# Patient Record
Sex: Female | Born: 1990 | Race: Black or African American | Hispanic: No | Marital: Single | Smoking: Never smoker
Health system: Southern US, Community
[De-identification: ages and names within clinical notes are randomized; demographics above are authoritative.]

## PROBLEM LIST (undated history)

## (undated) DIAGNOSIS — R87629 Unspecified abnormal cytological findings in specimens from vagina: Secondary | ICD-10-CM

## (undated) DIAGNOSIS — D649 Anemia, unspecified: Secondary | ICD-10-CM

## (undated) DIAGNOSIS — N946 Dysmenorrhea, unspecified: Secondary | ICD-10-CM

## (undated) DIAGNOSIS — Z91013 Allergy to seafood: Secondary | ICD-10-CM

## (undated) DIAGNOSIS — I10 Essential (primary) hypertension: Secondary | ICD-10-CM

## (undated) DIAGNOSIS — O139 Gestational [pregnancy-induced] hypertension without significant proteinuria, unspecified trimester: Secondary | ICD-10-CM

## (undated) HISTORY — DX: Essential (primary) hypertension: I10

## (undated) HISTORY — DX: Dysmenorrhea, unspecified: N94.6

## (undated) HISTORY — DX: Allergy to seafood: Z91.013

## (undated) HISTORY — DX: Anemia, unspecified: D64.9

## (undated) HISTORY — PX: TONSILLECTOMY: SUR1361

---
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
## 2013-10-20 ENCOUNTER — Ambulatory Visit: Payer: Medicaid Other | Admitting: Family

## 2013-10-28 ENCOUNTER — Encounter: Payer: Self-pay | Admitting: Family

## 2013-10-28 ENCOUNTER — Ambulatory Visit (INDEPENDENT_AMBULATORY_CARE_PROVIDER_SITE_OTHER): Payer: Commercial Managed Care - PPO | Admitting: Family

## 2013-10-28 VITALS — BP 100/90 | HR 100 | Ht 60.0 in | Wt 189.0 lb

## 2013-10-28 DIAGNOSIS — Z0000 Encounter for general adult medical examination without abnormal findings: Secondary | ICD-10-CM

## 2013-10-28 MED ORDER — EPINEPHRINE 0.3 MG/0.3ML IJ SOAJ: 0.3000 mg | Freq: Once | INTRAMUSCULAR | Status: DC

## 2013-10-28 NOTE — Progress Notes (Signed)
Subjective:    Patient ID: Jennifer Curtis, female    DOB: 01/26/1991, 23 y.o.   MRN: [id number]  HPI  23 year old [place] female, new patient to the practice and to be established. 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 She is sexually active with one female partner and sees gynecology for woman care. Reports exercising approximately once per week. Perform self breast exams monthly. Immunizations up-to-date. Has a fish allergy.   Review of Systems  Constitutional: Negative.   HENT: Negative.   Eyes: Negative.   Respiratory: Negative.   Cardiovascular: Negative.   Gastrointestinal: Negative.   Endocrine: Negative.   Genitourinary: Negative.   Musculoskeletal: Negative.   Skin: Negative.   Allergic/Immunologic: Negative.   Neurological: Negative.   Hematological: Negative.   Psychiatric/Behavioral: Negative.    History reviewed. No pertinent past medical history.  History   Social History  . Marital Status: Single    Spouse Name: N/A    Number of Children: N/A  . Years of Education: N/A   Occupational History  . Not on file.   Social History Main Topics  . Smoking status: Never Smoker   . Smokeless tobacco: Not on file  . Alcohol Use: Yes     Comment: occassionally  . Drug Use: No  . Sexual Activity: Not on file   Other Topics Concern  . Not on file   Social History Narrative  . No narrative on file    Past Surgical History  Procedure Laterality Date  . Tonsillectomy      No family history on file.  Allergies  Allergen Reactions  . Fish Allergy Anaphylaxis    No current outpatient prescriptions on file prior to visit.   No current facility-administered medications on file prior to visit.    BP 100/90  Pulse 100  Ht 5' (1.524 m)  Wt 189 lb (85.73 kg)  BMI 36.91 kg/m2chart    Objective:   Physical Exam  Constitutional: She is oriented to person, place, and time. She appears well-developed and well-nourished.    HENT:  Head: Normocephalic and atraumatic.  Right Ear: External ear normal.  Left Ear: External ear normal.  Nose: Nose normal.  Mouth/Throat: Oropharynx is clear and moist.  Eyes: Conjunctivae and EOM are normal. Pupils are equal, round, and reactive to light.  Neck: Normal range of motion. Neck supple. No thyromegaly present.  Cardiovascular: Normal rate, regular rhythm and normal heart sounds.   Pulmonary/Chest: Effort normal and breath sounds normal.  Abdominal: Soft. Bowel sounds are normal. She exhibits no distension. There is no tenderness. There is no rebound.  Musculoskeletal: Normal range of motion. She exhibits no edema and no tenderness.  Neurological: She is alert and oriented to person, place, and time. She has normal reflexes. She displays normal reflexes. No cranial nerve deficit. Coordination normal.  Skin: Skin is warm and dry.  Psychiatric: She has a normal mood and affect.          Assessment & Plan:  Jennifer Curtis was seen today for establish care.  Diagnoses and associated orders for this visit:  Preventative health care - CMP; Future - CBC with Differential; Future - TSH; Future - Lipid Panel; Future  Other Orders - EPINEPHrine (EPIPEN) 0.3 mg/0.3 mL SOAJ injection; Inject 0.3 mLs (0.3 mg total) into the muscle once.    Return for fasting labs. Encouraged healthy diet and exercise. Weight reduction. Monthly self breast exams. Safe sex practices. Followup with gynecology for  Pap smear.

## 2013-10-28 NOTE — Progress Notes (Signed)
Pre visit review using our clinic review tool, if applicable. No additional management support is needed unless otherwise documented below in the visit note. 

## 2013-10-28 NOTE — Patient Instructions (Signed)

## 2013-11-03 ENCOUNTER — Other Ambulatory Visit (INDEPENDENT_AMBULATORY_CARE_PROVIDER_SITE_OTHER): Payer: Commercial Managed Care - PPO

## 2013-11-03 DIAGNOSIS — Z0000 Encounter for general adult medical examination without abnormal findings: Secondary | ICD-10-CM

## 2013-11-03 LAB — CBC WITH DIFFERENTIAL/PLATELET
BASOS ABS: 0 10*3/uL (ref 0.0–0.1)
BASOS PCT: 0.5 % (ref 0.0–3.0)
EOS ABS: 0.1 10*3/uL (ref 0.0–0.7)
Eosinophils Relative: 1.7 % (ref 0.0–5.0)
HCT: 37.6 % (ref 36.0–46.0)
HEMOGLOBIN: 12.4 g/dL (ref 12.0–15.0)
LYMPHS ABS: 3.2 10*3/uL (ref 0.7–4.0)
LYMPHS PCT: 39.9 % (ref 12.0–46.0)
MCHC: 32.9 g/dL (ref 30.0–36.0)
MCV: 90.9 fl (ref 78.0–100.0)
MONO ABS: 0.5 10*3/uL (ref 0.1–1.0)
Monocytes Relative: 6 % (ref 3.0–12.0)
NEUTROS ABS: 4.2 10*3/uL (ref 1.4–7.7)
Neutrophils Relative %: 51.9 % (ref 43.0–77.0)
Platelets: 234 10*3/uL (ref 150.0–400.0)
RBC: 4.14 Mil/uL (ref 3.87–5.11)
RDW: 13 % (ref 11.5–14.6)
WBC: 8.1 10*3/uL (ref 4.5–10.5)

## 2013-11-03 LAB — COMPREHENSIVE METABOLIC PANEL
ALK PHOS: 68 U/L (ref 39–117)
ALT: 21 U/L (ref 0–35)
AST: 16 U/L (ref 0–37)
Albumin: 3.6 g/dL (ref 3.5–5.2)
BUN: 11 mg/dL (ref 6–23)
CALCIUM: 8.9 mg/dL (ref 8.4–10.5)
CHLORIDE: 105 meq/L (ref 96–112)
CO2: 24 mEq/L (ref 19–32)
CREATININE: 1 mg/dL (ref 0.4–1.2)
GFR: 89.29 mL/min (ref >60.00)
Glucose, Bld: 77 mg/dL (ref 70–99)
POTASSIUM: 3.9 meq/L (ref 3.5–5.1)
Sodium: 135 mEq/L (ref 135–145)
Total Bilirubin: 0.7 mg/dL (ref 0.3–1.2)
Total Protein: 7 g/dL (ref 6.0–8.3)

## 2013-11-03 LAB — LIPID PANEL
CHOL/HDL RATIO: 3
Cholesterol: 114 mg/dL (ref 0–200)
HDL: 37 mg/dL — ABNORMAL LOW (ref >39.00)
LDL CALC: 63 mg/dL (ref 0–99)
TRIGLYCERIDES: 69 mg/dL (ref 0.0–149.0)
VLDL: 13.8 mg/dL (ref 0.0–40.0)

## 2013-11-03 LAB — TSH: TSH: 0.88 u[IU]/mL (ref 0.35–5.50)

## 2014-11-02 LAB — HM PAP SMEAR

## 2014-12-07 ENCOUNTER — Ambulatory Visit: Payer: Commercial Managed Care - PPO | Admitting: Family Medicine

## 2014-12-30 ENCOUNTER — Ambulatory Visit (INDEPENDENT_AMBULATORY_CARE_PROVIDER_SITE_OTHER): Payer: Commercial Managed Care - PPO | Admitting: Family Medicine

## 2014-12-30 DIAGNOSIS — R69 Illness, unspecified: Secondary | ICD-10-CM

## 2014-12-30 NOTE — Progress Notes (Signed)
No show for new patient visit.  ° °

## 2015-01-19 ENCOUNTER — Encounter: Payer: Self-pay | Admitting: Family Medicine

## 2015-01-19 ENCOUNTER — Ambulatory Visit (INDEPENDENT_AMBULATORY_CARE_PROVIDER_SITE_OTHER): Payer: Commercial Managed Care - PPO | Admitting: Family Medicine

## 2015-01-19 VITALS — BP 116/80 | HR 95 | Temp 98.1°F | Ht 60.0 in | Wt 187.4 lb

## 2015-01-19 DIAGNOSIS — Z7689 Persons encountering health services in other specified circumstances: Secondary | ICD-10-CM

## 2015-01-19 DIAGNOSIS — Z91013 Allergy to seafood: Secondary | ICD-10-CM | POA: Diagnosis not present

## 2015-01-19 DIAGNOSIS — D649 Anemia, unspecified: Secondary | ICD-10-CM | POA: Diagnosis not present

## 2015-01-19 DIAGNOSIS — N921 Excessive and frequent menstruation with irregular cycle: Secondary | ICD-10-CM | POA: Diagnosis not present

## 2015-01-19 DIAGNOSIS — N920 Excessive and frequent menstruation with regular cycle: Secondary | ICD-10-CM | POA: Insufficient documentation

## 2015-01-19 DIAGNOSIS — Z7189 Other specified counseling: Secondary | ICD-10-CM | POA: Diagnosis not present

## 2015-01-19 MED ORDER — EPINEPHRINE 0.3 MG/0.3ML IJ SOAJ: 0.3000 mg | Freq: Once | INTRAMUSCULAR | Status: DC

## 2015-01-19 NOTE — Progress Notes (Signed)
HPI:  Jennifer Curtis is here to establish care. Used to see Padonda.  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 Anemia     heavy menstrual bleeding, since 24 yo per her report  . Dysmenorrhea   . Shellfish allergy     Past Surgical History  Procedure Laterality Date  . Tonsillectomy      Family History  Problem Relation Age of Onset  . Diabetes Maternal Grandmother   . Diabetes Maternal Grandfather   . Diabetes Paternal Grandmother   . Kidney disease Paternal Grandmother   . Cancer Paternal Grandfather     History   Social History  . Marital Status: Single    Spouse Name: N/A  . Number of Children: N/A  . Years of Education: N/A   Social History Main Topics  . Smoking status: Never Smoker   . Smokeless tobacco: Not on file  . Alcohol Use: Yes     Comment: occassionally 1 glass per month  . Drug Use: No  . Sexual Activity: Not on file   Other Topics Concern  . 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  EPINEPHrine (EPIPEN) 0.3 mg/0.3 mL SOAJ injection, Inject 0.3 mLs (0.3 mg total) into the muscle once., Disp: 1 Device, Rfl: 4  EXAM:  Filed Vitals:   01/19/15 0948  BP: 116/80  Pulse: 95  Temp: 98.1 F (36.7 C)    Body mass index is 36.6 kg/(m^2).  GENERAL: vitals reviewed and listed above, alert, oriented, appears well hydrated and in no acute distress  HEENT: atraumatic, conjunttiva clear, no obvious abnormalities on inspection of external nose and ears  NECK: no obvious masses on inspection  LUNGS: clear to auscultation bilaterally, no wheezes, rales or rhonchi, good air movement  CV: HRRR, no peripheral edema  MS: moves all extremities without noticeable abnormality  PSYCH: pleasant and cooperative, no obvious depression or anxiety  ASSESSMENT AND PLAN:  Discussed the following assessment and plan:  Shellfish allergy -refilled epi - advised 911 if ever having serious reaction requiring epipen  Obesity: -lifestyle recs  Anemia, unspecified anemia type Menorrhagia with irregular cycle -seeing gyn for this  Encounter to establish care -We reviewed the PMH, PSH, FH, SH, Meds and Allergies. -We provided refills for any medications we will prescribe as needed. -We addressed current concerns per orders and patient instructions. -We have asked for records for pertinent exams, studies, vaccines and notes from previous providers. -We have  advised patient to follow up per instructions below.   -Patient advised to return or notify a doctor immediately if symptoms worsen or persist or new concerns arise.  Patient Instructions  BEFORE YOU LEAVE: -CPE at your convenience, come fasting  We recommend the following healthy lifestyle measures: - eat a healthy diet consisting of lots of vegetables, fruits, beans, nuts, seeds, healthy meats such as white chicken and fish and whole grains.  - avoid fried foods, starches, sweets, fast food,  processed foods, sodas, red meet and other fattening foods.  - get a least 150 minutes of aerobic exercise per week.       Kriste BasqueKIM, HANNAH R.

## 2015-01-19 NOTE — Addendum Note (Signed)
Addended by: Sallee LangeFUNDERBURK, JO A on: 01/19/2015 10:14 AM   Modules accepted: Orders

## 2015-01-19 NOTE — Progress Notes (Signed)
Pre visit review using our clinic review tool, if applicable. No additional management support is needed unless otherwise documented below in the visit note. 

## 2015-01-19 NOTE — Patient Instructions (Signed)
BEFORE YOU LEAVE: -CPE at your convenience, come fasting  We recommend the following healthy lifestyle measures: - eat a healthy diet consisting of lots of vegetables, fruits, beans, nuts, seeds, healthy meats such as white chicken and fish and whole grains.  - avoid fried foods, starches, sweets, fast food, processed foods, sodas, red meet and other fattening foods.  - get a least 150 minutes of aerobic exercise per week.

## 2015-02-03 ENCOUNTER — Ambulatory Visit: Payer: Commercial Managed Care - PPO | Admitting: Family Medicine

## 2015-03-18 ENCOUNTER — Encounter (HOSPITAL_COMMUNITY): Payer: Self-pay

## 2015-03-18 ENCOUNTER — Emergency Department (HOSPITAL_COMMUNITY): Payer: Commercial Managed Care - PPO

## 2015-03-18 ENCOUNTER — Emergency Department (HOSPITAL_COMMUNITY)
Admission: EM | Admit: 2015-03-18 | Discharge: 2015-03-18 | Disposition: A | Discharge status: Home / Self Care | Payer: Commercial Managed Care - PPO | Attending: Emergency Medicine | Admitting: Emergency Medicine

## 2015-03-18 DIAGNOSIS — N12 Tubulo-interstitial nephritis, not specified as acute or chronic: Secondary | ICD-10-CM

## 2015-03-18 DIAGNOSIS — Z862 Personal history of diseases of the blood and blood-forming organs and certain disorders involving the immune mechanism: Secondary | ICD-10-CM | POA: Insufficient documentation

## 2015-03-18 DIAGNOSIS — Z8742 Personal history of other diseases of the female genital tract: Secondary | ICD-10-CM | POA: Diagnosis not present

## 2015-03-18 DIAGNOSIS — R109 Unspecified abdominal pain: Secondary | ICD-10-CM | POA: Diagnosis present

## 2015-03-18 LAB — URINALYSIS, ROUTINE W REFLEX MICROSCOPIC
Bilirubin Urine: NEGATIVE
Glucose, UA: NEGATIVE mg/dL
KETONES UR: NEGATIVE mg/dL
NITRITE: NEGATIVE
PH: 6.5 (ref 5.0–8.0)
Protein, ur: NEGATIVE mg/dL
SPECIFIC GRAVITY, URINE: 1.019 (ref 1.005–1.030)
Urobilinogen, UA: 0.2 mg/dL (ref 0.0–1.0)

## 2015-03-18 LAB — CBC WITH DIFFERENTIAL/PLATELET
BASOS ABS: 0 10*3/uL (ref 0.0–0.1)
Basophils Relative: 0 % (ref 0–1)
EOS PCT: 1 % (ref 0–5)
Eosinophils Absolute: 0.1 10*3/uL (ref 0.0–0.7)
HEMATOCRIT: 38.6 % (ref 36.0–46.0)
Hemoglobin: 12.8 g/dL (ref 12.0–15.0)
Lymphocytes Relative: 27 % (ref 12–46)
Lymphs Abs: 2.5 10*3/uL (ref 0.7–4.0)
MCH: 29.5 pg (ref 26.0–34.0)
MCHC: 33.2 g/dL (ref 30.0–36.0)
MCV: 88.9 fL (ref 78.0–100.0)
Monocytes Absolute: 0.5 10*3/uL (ref 0.1–1.0)
Monocytes Relative: 6 % (ref 3–12)
NEUTROS ABS: 6.1 10*3/uL (ref 1.7–7.7)
NEUTROS PCT: 66 % (ref 43–77)
Platelets: 246 10*3/uL (ref 150–400)
RBC: 4.34 MIL/uL (ref 3.87–5.11)
RDW: 12.8 % (ref 11.5–15.5)
WBC: 9.2 10*3/uL (ref 4.0–10.5)

## 2015-03-18 LAB — BASIC METABOLIC PANEL
ANION GAP: 6 (ref 5–15)
BUN: 16 mg/dL (ref 6–20)
CO2: 23 mmol/L (ref 22–32)
Calcium: 9.3 mg/dL (ref 8.9–10.3)
Chloride: 108 mmol/L (ref 101–111)
Creatinine, Ser: 1.02 mg/dL — ABNORMAL HIGH (ref 0.44–1.00)
GFR calc non Af Amer: >60 mL/min (ref >60)
GLUCOSE: 96 mg/dL (ref 65–99)
POTASSIUM: 3.8 mmol/L (ref 3.5–5.1)
SODIUM: 137 mmol/L (ref 135–145)

## 2015-03-18 LAB — I-STAT BETA HCG BLOOD, ED (MC, WL, AP ONLY): I-stat hCG, quantitative: <5 m[IU]/mL (ref <5)

## 2015-03-18 LAB — URINE MICROSCOPIC-ADD ON

## 2015-03-18 MED ORDER — SODIUM CHLORIDE 0.9 % IV BOLUS (SEPSIS)
1000.0000 mL | Freq: Once | INTRAVENOUS | Status: AC
  Administered 2015-03-18: 1000 mL via INTRAVENOUS

## 2015-03-18 MED ORDER — CEPHALEXIN 500 MG PO CAPS: 500.0000 mg | ORAL_CAPSULE | Freq: Four times a day (QID) | ORAL | Status: DC

## 2015-03-18 MED ORDER — ONDANSETRON HCL 4 MG/2ML IJ SOLN
4.0000 mg | Freq: Once | INTRAMUSCULAR | Status: AC
  Administered 2015-03-18: 4 mg via INTRAVENOUS
  Filled 2015-03-18: qty 2

## 2015-03-18 MED ORDER — ONDANSETRON 4 MG PO TBDP: 4.0000 mg | ORAL_TABLET | Freq: Three times a day (TID) | ORAL | Status: DC | PRN

## 2015-03-18 MED ORDER — TRAMADOL HCL 50 MG PO TABS: 50.0000 mg | ORAL_TABLET | Freq: Four times a day (QID) | ORAL | Status: DC | PRN

## 2015-03-18 MED ORDER — KETOROLAC TROMETHAMINE 30 MG/ML IJ SOLN
30.0000 mg | Freq: Once | INTRAMUSCULAR | Status: AC
  Administered 2015-03-18: 30 mg via INTRAVENOUS
  Filled 2015-03-18: qty 1

## 2015-03-18 MED ORDER — DEXTROSE 5 % IV SOLN
1.0000 g | Freq: Once | INTRAVENOUS | Status: AC
  Administered 2015-03-18: 1 g via INTRAVENOUS
  Filled 2015-03-18: qty 10

## 2015-03-18 NOTE — ED Notes (Addendum)
Patient reports sudden onset of cold sweats, nausea, and abdominal pain tonight.  States she has had burning with urination and urinary frequency for a couple of days.

## 2015-03-18 NOTE — Discharge Instructions (Signed)
Take keflex as directed until gone. Take Tramadol as needed for pain. Take zofran as needed for nausea. Refer to attached documents for more information.

## 2015-03-18 NOTE — ED Provider Notes (Signed)
CSN: [id number]     Arrival date & time 03/18/15  0026 History   First MD Initiated Contact with Patient 03/18/15 [phone]     Chief Complaint  Patient presents with  . Flank Pain     (Consider location/radiation/quality/duration/timing/severity/associated sxs/prior Treatment) HPI Comments: Jennifer Curtis, 24 y/o female presents with flank pain and dysuria. Her symptoms began at 9:30 last night and have been worsening. She also has associated nausea. She has noticed small amounts of hematuria. She has never had a kidney stone but her grandmother had kidney stones. She is sexually active in a monogamous relationship.  Patient is a 24 y.o. female presenting with flank pain. The history is provided by the patient.  Flank Pain This is a new problem. The current episode started yesterday. The problem occurs constantly. The problem has been gradually worsening. Associated symptoms include nausea. Pertinent negatives include no abdominal pain or vomiting. She has tried nothing for the symptoms.    Past Medical History  Diagnosis Date  . Anemia     heavy menstrual bleeding, since 24 yo per her report  . Dysmenorrhea   . Shellfish allergy    Past Surgical History  Procedure Laterality Date  . Tonsillectomy     Family History  Problem Relation Age of Onset  . Diabetes Maternal Grandmother   . Diabetes Maternal Grandfather   . Diabetes Paternal Grandmother   . Kidney disease Paternal Grandmother   . Cancer Paternal Grandfather    History  Substance Use Topics  . Smoking status: Never Smoker   . Smokeless tobacco: Not on file  . Alcohol Use: No     Comment: occassionally 1 glass per month   OB History    No data available     Review of Systems  Gastrointestinal: Positive for nausea. Negative for vomiting and abdominal pain.  Genitourinary: Positive for dysuria and flank pain.  All other systems reviewed and are negative.     Allergies  Fish allergy  Home Medications    Prior to Admission medications   Medication Sig Start Date End Date Taking? Authorizing Provider  EPINEPHrine 0.3 mg/0.3 mL IJ SOAJ injection Inject 0.3 mLs (0.3 mg total) into the muscle once. 01/19/15  Yes Terressa Koyanagi, DO  medroxyPROGESTERone (DEPO-PROVERA) 150 MG/ML injection Inject 150 mg into the muscle every 3 (three) months.   Yes Historical Provider, MD   BP 125/73 mmHg  Pulse 88  Temp(Src) 98.6 F (37 C) (Oral)  Resp 18  Ht 5' (1.524 m)  Wt 187 lb (84.823 kg)  BMI 36.52 kg/m2  SpO2 100% Physical Exam  Constitutional: She is oriented to person, place, and time. She appears well-developed and well-nourished. No distress.  HENT:  Head: Normocephalic and atraumatic.  Eyes: Conjunctivae and EOM are normal.  Neck: Normal range of motion.  Cardiovascular: Normal rate and regular rhythm.  Exam reveals no gallop and no friction rub.   No murmur heard. Pulmonary/Chest: Effort normal and breath sounds normal. She has no wheezes. She has no rales. She exhibits no tenderness.  Abdominal: Soft. Bowel sounds are normal. She exhibits no distension. There is no tenderness. There is no rebound.  Genitourinary:  Bilateral CVA tenderness to palpation.   Musculoskeletal: Normal range of motion.  Neurological: She is alert and oriented to person, place, and time. Coordination normal.  Speech is goal-oriented. Moves limbs without ataxia.   Skin: Skin is warm and dry.  Psychiatric: She has a normal mood and affect. Her behavior is normal.  Nursing note and vitals reviewed.   ED Course  Procedures (including critical care time) Labs Review Labs Reviewed  URINALYSIS, ROUTINE W REFLEX MICROSCOPIC (NOT AT [facility]) - Abnormal; Notable for the following:    APPearance CLOUDY (*)    Hgb urine dipstick MODERATE (*)    Leukocytes, UA MODERATE (*)    All other components within normal limits  URINE MICROSCOPIC-ADD ON - Abnormal; Notable for the following:    Squamous Epithelial / LPF FEW (*)     Bacteria, UA FEW (*)    All other components within normal limits  CBC WITH DIFFERENTIAL/PLATELET  BASIC METABOLIC PANEL  I-STAT BETA HCG BLOOD, ED ([facility], [facility], [facility] ONLY)    Imaging Review Ct Abdomen Pelvis Wo Contrast  03/18/2015   CLINICAL DATA:  Cold sweats, nausea, and abdominal pain tonight. Burning with urination in urinary frequency for couple of days. Blood in the urine. Pain on both sides.  EXAM: CT ABDOMEN AND PELVIS WITHOUT CONTRAST  TECHNIQUE: Multidetector CT imaging of the abdomen and pelvis was performed following the standard protocol without IV contrast.  COMPARISON:  None.  FINDINGS: Mild dependent changes in the lung bases.  Kidneys are symmetrical in size and shape. No hydronephrosis or hydroureter. No renal, ureteral, or bladder stones. Bladder wall is not thickened.  The unenhanced appearance of the liver, spleen, gallbladder, pancreas, adrenal glands, abdominal aorta, inferior vena cava, and retroperitoneal lymph nodes is unremarkable. Stomach and small bowel are decompressed. Stool-filled colon without abnormal distention. No free air or free fluid in the abdomen. Abdominal wall musculature appears intact.  Pelvis: Appendix is normal. Uterus and ovaries are not enlarged. Small amount of free fluid in the pelvis is likely physiologic. No pelvic mass or lymphadenopathy. Sigmoid colon is unremarkable. No destructive bone lesions.  IMPRESSION: No renal or ureteral stone or obstruction. Small amount of free fluid in the pelvis is likely physiologic.   Electronically Signed   By: Burman NievesWilliam  Stevens M.D.   On: 03/18/2015 03:51     EKG Interpretation None      MDM   Final diagnoses:  Pyelonephritis    4:31 AM CT shows no kidney stone. Labs shows no acute changes. Urinalysis shows UTI. Patient will be treated with rocephin. Vitals stable and patient afebrile. Patient will have tramadol for pain and zofran for nausea. Patient instructed to follow up with PCP and return to the ED with  worsening or concerning symptoms.     Jennifer BeckKaitlyn Gudelia Eugene, PA-C 03/18/15 [id number]  April Palumbo, MD 03/18/15 0600

## 2015-03-18 NOTE — ED Notes (Signed)
Pt stated that she has had cold sweats, flank pain, and "a spot of blood in her urine" since 10 pm tonight.

## 2015-03-18 NOTE — ED Notes (Signed)
Pt transported to CT ?

## 2015-03-21 LAB — URINE CULTURE
Culture: >=100000
SPECIAL REQUESTS: NORMAL

## 2015-03-22 ENCOUNTER — Telehealth (HOSPITAL_COMMUNITY): Payer: Self-pay

## 2015-03-22 NOTE — ED Notes (Signed)
Post ED Visit - Positive Culture Follow-up  Culture report reviewed by antimicrobial stewardship pharmacist: []  Wes Dulaney, Pharm.D., BCPS [x]  Celedonio MiyamotoJeremy Frens, Pharm.D., BCPS []  Georgina PillionElizabeth Martin, Pharm.D., BCPS []  Homer C JonesMinh Pham, [address]Ph[address]D., BCPS, AAHIVP []  Estella HuskMichelle Turner, Pharm.D., BCPS, AAHIVP []  Elder CyphersLorie Poole, [address]Pharm.D., BCPS  Positive urjne culture Treated with cephalexin, organism sensitive to the same and no further patient follow-up is required at this time.  Ashley JacobsFesterman, Redith Drach C 03/22/2015, 11:34 AM

## 2015-03-25 HISTORY — PX: CLOSED REDUCTION CLAVICULAR: SHX5006

## 2015-07-06 ENCOUNTER — Ambulatory Visit (INDEPENDENT_AMBULATORY_CARE_PROVIDER_SITE_OTHER): Payer: Commercial Managed Care - PPO | Admitting: Family Medicine

## 2015-07-06 ENCOUNTER — Encounter: Payer: Self-pay | Admitting: Family Medicine

## 2015-07-06 VITALS — BP 110/80 | HR 102 | Temp 98.2°F | Ht 60.0 in | Wt 181.8 lb

## 2015-07-06 DIAGNOSIS — Z6835 Body mass index (BMI) 35.0-35.9, adult: Secondary | ICD-10-CM

## 2015-07-06 DIAGNOSIS — Z0000 Encounter for general adult medical examination without abnormal findings: Secondary | ICD-10-CM | POA: Diagnosis not present

## 2015-07-06 DIAGNOSIS — E785 Hyperlipidemia, unspecified: Secondary | ICD-10-CM

## 2015-07-06 DIAGNOSIS — Z299 Encounter for prophylactic measures, unspecified: Secondary | ICD-10-CM

## 2015-07-06 DIAGNOSIS — Z23 Encounter for immunization: Secondary | ICD-10-CM | POA: Diagnosis not present

## 2015-07-06 LAB — LIPID PANEL
Cholesterol: 122 mg/dL (ref 0–200)
HDL: 41.3 mg/dL (ref >39.00)
LDL Cholesterol: 73 mg/dL (ref 0–99)
NonHDL: 80.55
Total CHOL/HDL Ratio: 3
Triglycerides: 40 mg/dL (ref 0.0–149.0)
VLDL: 8 mg/dL (ref 0.0–40.0)

## 2015-07-06 NOTE — Patient Instructions (Signed)
BEFORE YOU LEAVE: -flu shot -labs  We recommend the following healthy lifestyle measures: - eat a healthy whole foods diet consisting of regular small meals composed of vegetables, fruits, beans, nuts, seeds, healthy meats such as white chicken and fish and whole grains.  - avoid sweets, white starchy foods, fried foods, fast food, processed foods, sodas, red meet and other fattening foods.  - get a least 150-300 minutes of aerobic exercise per week.   Follow up yearly and as needed

## 2015-07-06 NOTE — Progress Notes (Signed)
Pre visit review using our clinic review tool, if applicable. No additional management support is needed unless otherwise documented below in the visit note. 
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
## 2015-08-26 ENCOUNTER — Emergency Department (HOSPITAL_COMMUNITY)
Admission: EM | Admit: 2015-08-26 | Discharge: 2015-08-26 | Disposition: A | Discharge status: Home / Self Care | Payer: Commercial Managed Care - PPO | Attending: Emergency Medicine | Admitting: Emergency Medicine

## 2015-08-26 ENCOUNTER — Encounter (HOSPITAL_COMMUNITY): Payer: Self-pay

## 2015-08-26 DIAGNOSIS — H9201 Otalgia, right ear: Secondary | ICD-10-CM | POA: Diagnosis not present

## 2015-08-26 DIAGNOSIS — Z202 Contact with and (suspected) exposure to infections with a predominantly sexual mode of transmission: Secondary | ICD-10-CM | POA: Insufficient documentation

## 2015-08-26 DIAGNOSIS — Z862 Personal history of diseases of the blood and blood-forming organs and certain disorders involving the immune mechanism: Secondary | ICD-10-CM | POA: Diagnosis not present

## 2015-08-26 DIAGNOSIS — R51 Headache: Secondary | ICD-10-CM | POA: Diagnosis not present

## 2015-08-26 DIAGNOSIS — Z3202 Encounter for pregnancy test, result negative: Secondary | ICD-10-CM | POA: Insufficient documentation

## 2015-08-26 DIAGNOSIS — J029 Acute pharyngitis, unspecified: Secondary | ICD-10-CM | POA: Insufficient documentation

## 2015-08-26 DIAGNOSIS — R0981 Nasal congestion: Secondary | ICD-10-CM

## 2015-08-26 DIAGNOSIS — J3489 Other specified disorders of nose and nasal sinuses: Secondary | ICD-10-CM | POA: Insufficient documentation

## 2015-08-26 DIAGNOSIS — Z8742 Personal history of other diseases of the female genital tract: Secondary | ICD-10-CM | POA: Diagnosis not present

## 2015-08-26 LAB — URINALYSIS, ROUTINE W REFLEX MICROSCOPIC
BILIRUBIN URINE: NEGATIVE
GLUCOSE, UA: NEGATIVE mg/dL
HGB URINE DIPSTICK: NEGATIVE
KETONES UR: NEGATIVE mg/dL
Leukocytes, UA: NEGATIVE
NITRITE: NEGATIVE
PH: 5.5 (ref 5.0–8.0)
Protein, ur: NEGATIVE mg/dL
SPECIFIC GRAVITY, URINE: 1.03 (ref 1.005–1.030)

## 2015-08-26 LAB — I-STAT BETA HCG BLOOD, ED (MC, WL, AP ONLY)

## 2015-08-26 MED ORDER — PSEUDOEPHEDRINE HCL ER 120 MG PO TB12: 120.0000 mg | ORAL_TABLET | Freq: Two times a day (BID) | ORAL | Status: DC

## 2015-08-26 MED ORDER — TRIAMCINOLONE ACETONIDE 55 MCG/ACT NA AERO
2.0000 | INHALATION_SPRAY | Freq: Every day | NASAL | Status: DC
Start: 2015-08-26 — End: 2016-02-28
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
## 2015-08-26 NOTE — Discharge Instructions (Signed)
As discussed, today's evaluation has been largely reassuring.  There are several lab studies pending.  You will be notified of abnormal results.   Please obtain the prescribed medication for your sinus congestion.   Return here for concerning changes in your condition.

## 2015-08-26 NOTE — ED Notes (Signed)
Patient d/c'd self care.  F/U and medications reviewed.  Patient verbalized understanding. 

## 2015-08-26 NOTE — ED Notes (Signed)
Pt c/o sinus pressure and sore throat x 3 days and R ear pain starting this morning.  Pain score 8/10.  Pt reports taking Mucinex w/o relief.  Also, Pt would like a STD check.  Sts "I recently found out that my ex has an STD."  Denies STD symptoms.

## 2015-08-26 NOTE — ED Notes (Signed)
Patient c/o sore throat x3 day, denies fever.  States pain 8/10.  Patient also states here for STD check.

## 2015-08-27 LAB — HIV ANTIBODY (ROUTINE TESTING W REFLEX): HIV Screen 4th Generation wRfx: NONREACTIVE

## 2015-08-27 LAB — RPR: RPR Ser Ql: NONREACTIVE

## 2016-02-21 ENCOUNTER — Emergency Department (HOSPITAL_COMMUNITY)
Admission: EM | Admit: 2016-02-21 | Discharge: 2016-02-22 | Disposition: A | Discharge status: Home / Self Care | Payer: Commercial Managed Care - PPO | Attending: Emergency Medicine | Admitting: Emergency Medicine

## 2016-02-21 ENCOUNTER — Encounter (HOSPITAL_COMMUNITY): Payer: Self-pay | Admitting: Emergency Medicine

## 2016-02-21 DIAGNOSIS — N760 Acute vaginitis: Secondary | ICD-10-CM | POA: Diagnosis not present

## 2016-02-21 DIAGNOSIS — R51 Headache: Secondary | ICD-10-CM | POA: Diagnosis present

## 2016-02-21 DIAGNOSIS — B9689 Other specified bacterial agents as the cause of diseases classified elsewhere: Secondary | ICD-10-CM

## 2016-02-21 DIAGNOSIS — G44209 Tension-type headache, unspecified, not intractable: Secondary | ICD-10-CM | POA: Diagnosis not present

## 2016-02-21 DIAGNOSIS — N72 Inflammatory disease of cervix uteri: Secondary | ICD-10-CM | POA: Insufficient documentation

## 2016-02-21 NOTE — ED Notes (Signed)
During triage, pt requested this RN put in tests for STD.  Pt states her symptoms include vaginal itching and thick, white discharge.

## 2016-02-21 NOTE — ED Notes (Signed)
Pt states about a week and a half ago she began experiencing headaches.  Pt has taken ibuprofen and thera-flu without relief.  Headache has become more severe and today is around her eyes.  Pt states it has been "moving around" for the past week.

## 2016-02-22 LAB — WET PREP, GENITAL
Sperm: NONE SEEN
Trich, Wet Prep: NONE SEEN
Yeast Wet Prep HPF POC: NONE SEEN

## 2016-02-22 LAB — GC/CHLAMYDIA PROBE AMP (~~LOC~~) NOT AT ARMC
CHLAMYDIA, DNA PROBE: NEGATIVE
NEISSERIA GONORRHEA: NEGATIVE

## 2016-02-22 MED ORDER — ACETAMINOPHEN 325 MG PO TABS
650.0000 mg | ORAL_TABLET | Freq: Once | ORAL | Status: AC
  Administered 2016-02-22: 650 mg via ORAL
  Filled 2016-02-22: qty 2

## 2016-02-22 MED ORDER — KETOROLAC TROMETHAMINE 30 MG/ML IJ SOLN
30.0000 mg | Freq: Once | INTRAMUSCULAR | Status: AC
  Administered 2016-02-22: 30 mg via INTRAMUSCULAR
  Filled 2016-02-22: qty 1

## 2016-02-22 MED ORDER — AZITHROMYCIN 250 MG PO TABS
1000.0000 mg | ORAL_TABLET | Freq: Once | ORAL | Status: AC
  Administered 2016-02-22: 1000 mg via ORAL
  Filled 2016-02-22: qty 4

## 2016-02-22 MED ORDER — METRONIDAZOLE 500 MG PO TABS
500.0000 mg | ORAL_TABLET | Freq: Once | ORAL | Status: AC
  Administered 2016-02-22: 500 mg via ORAL
  Filled 2016-02-22: qty 1

## 2016-02-22 MED ORDER — LIDOCAINE HCL 1 % IJ SOLN
INTRAMUSCULAR | Status: AC
  Administered 2016-02-22: 0.9 mL
  Filled 2016-02-22: qty 20

## 2016-02-22 MED ORDER — METRONIDAZOLE 500 MG PO TABS: 500.0000 mg | ORAL_TABLET | Freq: Two times a day (BID) | ORAL | Status: DC

## 2016-02-22 MED ORDER — IBUPROFEN 200 MG PO TABS: 400.0000 mg | ORAL_TABLET | Freq: Four times a day (QID) | ORAL | Status: DC | PRN

## 2016-02-22 MED ORDER — CEFTRIAXONE SODIUM 250 MG IJ SOLR
250.0000 mg | Freq: Once | INTRAMUSCULAR | Status: AC
  Administered 2016-02-22: 250 mg via INTRAMUSCULAR
  Filled 2016-02-22: qty 250
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
## 2016-02-22 NOTE — Discharge Instructions (Signed)
Today your evaluation reveals that you have a condition called bacterial vaginosis , you've been treated with an antibiotic called Flagyl .  You've also received a one-time dose of azithromycin and Rocephin to treat gonorrhea, chlamydia  Your cultures are pending  You can safely take alternating doses of Tylenol, ibuprofen for your tension type headache  Cervicitis Cervicitis is a soreness and swelling (inflammation) of the cervix. Your cervix is located at the bottom of your uterus. It opens up to the vagina. CAUSES   Sexually transmitted infections (STIs).   Allergic reaction.   Medicines or birth control devices that are put in the vagina.   Injury to the cervix.   Bacterial infections.  RISK FACTORS You are at greater risk if you:  Have unprotected sexual intercourse.  Have sexual intercourse with many partners.  Began sexual intercourse at an early age.  Have a history of STIs. SYMPTOMS  There may be no symptoms. If symptoms occur, they may include:   Gray, white, yellow, or bad-smelling vaginal discharge.   Pain or itching of the area outside the vagina.   Painful sexual intercourse.   Lower abdominal or lower back pain, especially during intercourse.   Frequent urination.   Abnormal vaginal bleeding between periods, after sexual intercourse, or after menopause.   Pressure or a heavy feeling in the pelvis.  DIAGNOSIS  Diagnosis is made after a pelvic exam. Other tests may include:   Examination of any discharge under a microscope (wet prep).   A Pap test.  TREATMENT  Treatment will depend on the cause of cervicitis. If it is caused by an STI, both you and your partner will need to be treated. Antibiotic medicines will be given.  HOME CARE INSTRUCTIONS   Do not have sexual intercourse until your health care provider says it is okay.   Do not have sexual intercourse until your partner has been treated, if your cervicitis is caused by an STI.    Take your antibiotics as directed. Finish them even if you start to feel better.  SEEK MEDICAL CARE IF:  Your symptoms come back.   You have a fever.  MAKE SURE YOU:   Understand these instructions.  Will watch your condition.  Will get help right away if you are not doing well or get worse.   This information is not intended to replace advice given to you by your health care provider. Make sure you discuss any questions you have with your health care provider.   Document Released: 08/20/2005 Document Revised: 08/25/2013 Document Reviewed: 02/11/2013 Elsevier Interactive Patient Education [employer]2016 Elsevier Inc.

## 2016-02-22 NOTE — ED Notes (Signed)
Pt states she has been under extreme stress lately due to her new job and trying to find a new home. Pt has a headache at the base of her skull. Pt states theraflu helped relieve it, but motrin did not. Pt also has complaints of vaginal itching and discharge. Pt would like an STD check

## 2016-02-22 NOTE — ED Notes (Signed)
RN went in to round on pt. Pt was sleeping. Pt woke up and stated her pain in her head was 9/10.

## 2016-02-28 ENCOUNTER — Ambulatory Visit (INDEPENDENT_AMBULATORY_CARE_PROVIDER_SITE_OTHER): Payer: Commercial Managed Care - PPO | Admitting: Family Medicine

## 2016-02-28 ENCOUNTER — Other Ambulatory Visit (HOSPITAL_COMMUNITY)
Admission: RE | Admit: 2016-02-28 | Discharge: 2016-02-28 | Disposition: A | Discharge status: Home / Self Care | Payer: Commercial Managed Care - PPO | Source: Ambulatory Visit | Attending: Family Medicine | Admitting: Family Medicine

## 2016-02-28 ENCOUNTER — Encounter: Payer: Self-pay | Admitting: Family Medicine

## 2016-02-28 VITALS — BP 100/74 | HR 104 | Temp 98.1°F | Ht 60.0 in | Wt 190.2 lb

## 2016-02-28 DIAGNOSIS — N760 Acute vaginitis: Secondary | ICD-10-CM

## 2016-02-28 DIAGNOSIS — R51 Headache: Secondary | ICD-10-CM

## 2016-02-28 DIAGNOSIS — Z113 Encounter for screening for infections with a predominantly sexual mode of transmission: Secondary | ICD-10-CM | POA: Insufficient documentation

## 2016-02-28 DIAGNOSIS — R5383 Other fatigue: Secondary | ICD-10-CM

## 2016-02-28 DIAGNOSIS — R519 Headache, unspecified: Secondary | ICD-10-CM
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
## 2016-02-28 NOTE — Patient Instructions (Signed)
BEFORE YOU LEAVE: -labs -neck spasm exercises -follow up in 1 month  We have ordered labs or studies at this visit. It can take up to 1-2 weeks for results and processing. IF results require follow up or explanation, we will call you with instructions. Clinically stable results will be released to your [facility]. If you have not heard from us or cannot find your results in [facility] in 2 weeks please contact our office at [phone].  If you are not yet signed up for [facility], please consider signing up.  Do over the counter vaginal yeast treatment. Avoid harsh soaps and douching.

## 2016-02-28 NOTE — Progress Notes (Signed)
Pre visit review using our clinic review tool, if applicable. No additional management support is needed unless otherwise documented below in the visit note. 

## 2016-02-29 LAB — CBC WITH DIFFERENTIAL/PLATELET
BASOS PCT: 0.4 % (ref 0.0–3.0)
Basophils Absolute: 0 10*3/uL (ref 0.0–0.1)
EOS PCT: 1.2 % (ref 0.0–5.0)
Eosinophils Absolute: 0.1 10*3/uL (ref 0.0–0.7)
HEMATOCRIT: 39.2 % (ref 36.0–46.0)
HEMOGLOBIN: 13.1 g/dL (ref 12.0–15.0)
LYMPHS PCT: 29.8 % (ref 12.0–46.0)
Lymphs Abs: 2.8 10*3/uL (ref 0.7–4.0)
MCHC: 33.4 g/dL (ref 30.0–36.0)
MCV: 89.2 fl (ref 78.0–100.0)
Monocytes Absolute: 0.6 10*3/uL (ref 0.1–1.0)
Monocytes Relative: 5.9 % (ref 3.0–12.0)
Neutro Abs: 6 10*3/uL (ref 1.4–7.7)
Neutrophils Relative %: 62.7 % (ref 43.0–77.0)
Platelets: 267 10*3/uL (ref 150.0–400.0)
RBC: 4.39 Mil/uL (ref 3.87–5.11)
RDW: 13.4 % (ref 11.5–15.5)
WBC: 9.5 10*3/uL (ref 4.0–10.5)

## 2016-02-29 LAB — HEMOGLOBIN A1C: Hgb A1c MFr Bld: 5.2 % (ref 4.6–6.5)

## 2016-02-29 LAB — TSH: TSH: 1.39 u[IU]/mL (ref 0.35–4.50)

## 2016-03-01 LAB — CERVICOVAGINAL ANCILLARY ONLY
Chlamydia: NEGATIVE
NEISSERIA GONORRHEA: NEGATIVE
TRICH (WINDOWPATH): NEGATIVE

## 2016-03-05 LAB — CERVICOVAGINAL ANCILLARY ONLY
BACTERIAL VAGINITIS: POSITIVE — AB
Candida vaginitis: POSITIVE — AB

## 2016-03-07 ENCOUNTER — Other Ambulatory Visit: Payer: Self-pay | Admitting: *Deleted

## 2016-03-07 MED ORDER — FLUCONAZOLE 150 MG PO TABS: 150.0000 mg | ORAL_TABLET | Freq: Once | ORAL | Status: DC

## 2016-03-27 ENCOUNTER — Ambulatory Visit: Payer: Commercial Managed Care - PPO | Admitting: Family Medicine

## 2016-03-27 DIAGNOSIS — Z0289 Encounter for other administrative examinations: Secondary | ICD-10-CM
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
## 2016-04-24 ENCOUNTER — Encounter: Payer: Self-pay | Admitting: Family Medicine

## 2016-07-12 ENCOUNTER — Encounter: Payer: Self-pay | Admitting: Family Medicine

## 2016-07-19 ENCOUNTER — Other Ambulatory Visit: Payer: Self-pay | Admitting: Obstetrics

## 2016-09-14 IMAGING — CT CT ABD-PELV W/O CM
2 of 3 series · 17 of 33 positions shown, 19 images · non-contrast
Comparison: None.

CLINICAL DATA: Cold sweats, nausea, and abdominal pain tonight.
Burning with urination in urinary frequency for couple of days.
Blood in the urine. Pain on both sides.

EXAM:
CT ABDOMEN AND PELVIS WITHOUT CONTRAST
TECHNIQUE: Multidetector CT imaging of the abdomen and pelvis was performed
following the standard protocol without IV contrast.

[Series 4: lung · axial · 0.77mm/px · z∈[-106,-26]mm · 14 of 19 slices shown, 16 images]
[im 2/19  soft-tissue]
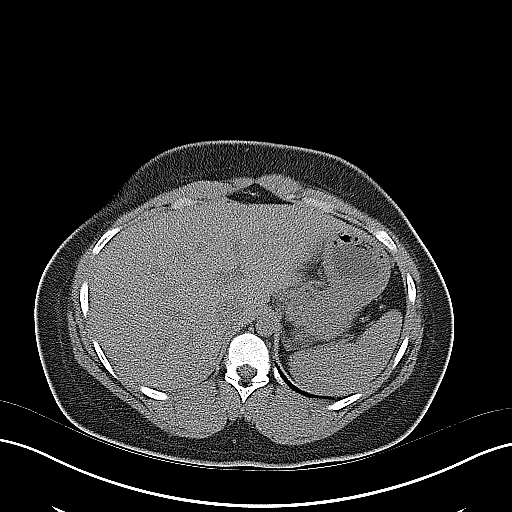
[im 2/19  bone]
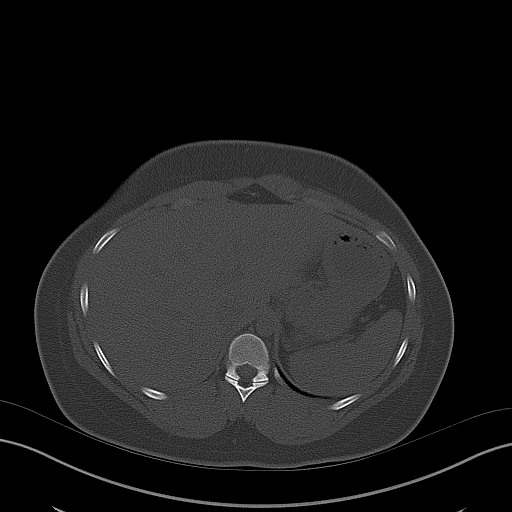
[im 3/19  soft-tissue]
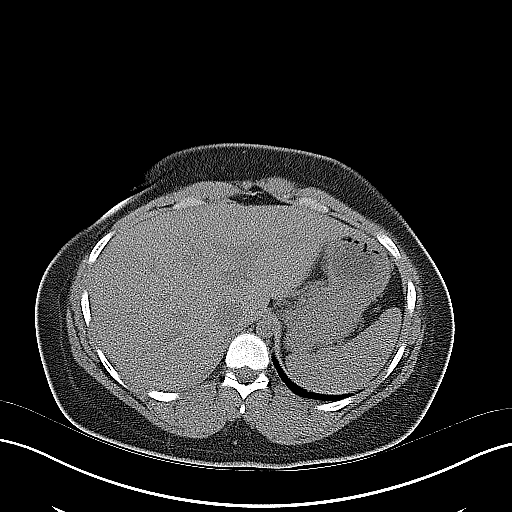
[im 5/19  soft-tissue]
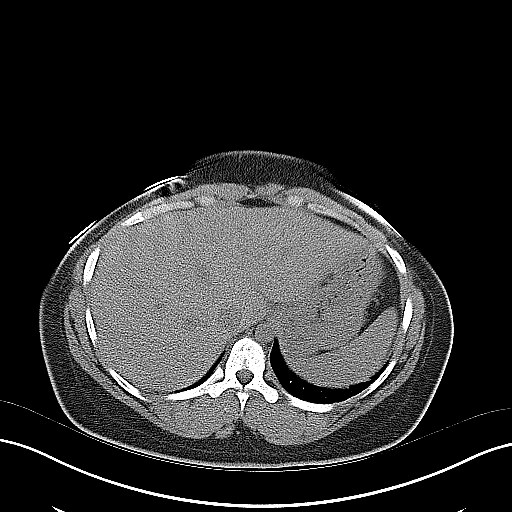
[im 6/19  soft-tissue]
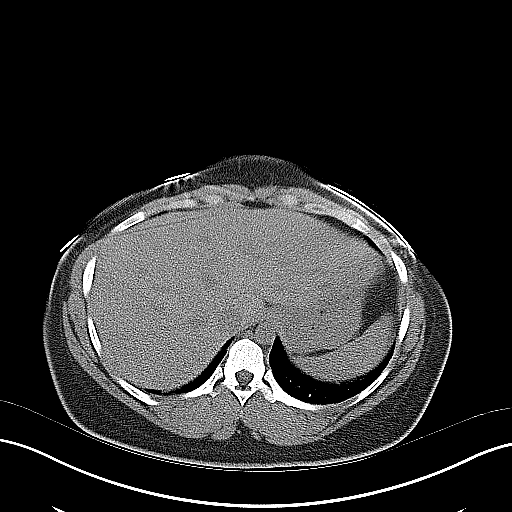
[im 7/19  soft-tissue]
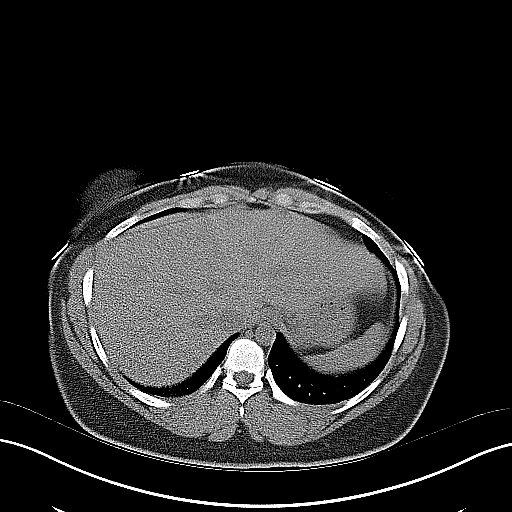
[im 8/19  soft-tissue]
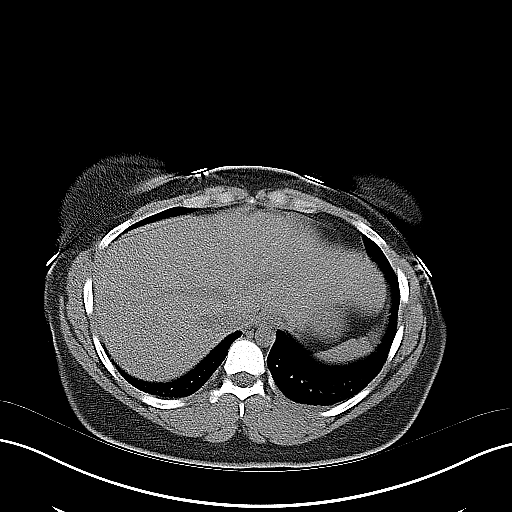
[im 9/19  soft-tissue]
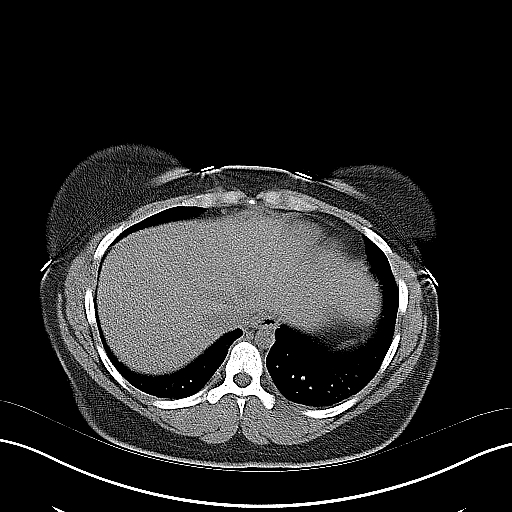
[im 11/19  soft-tissue]
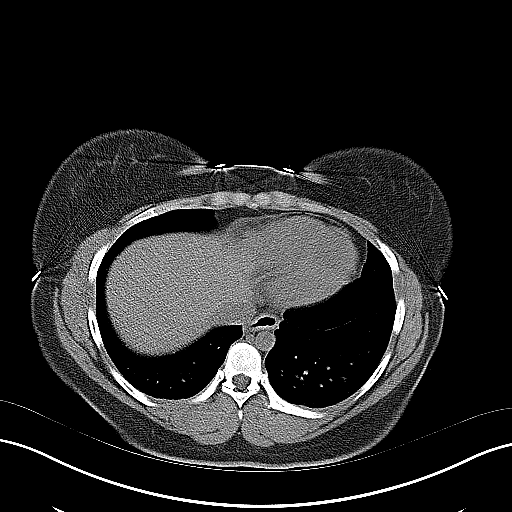
[im 12/19  soft-tissue]
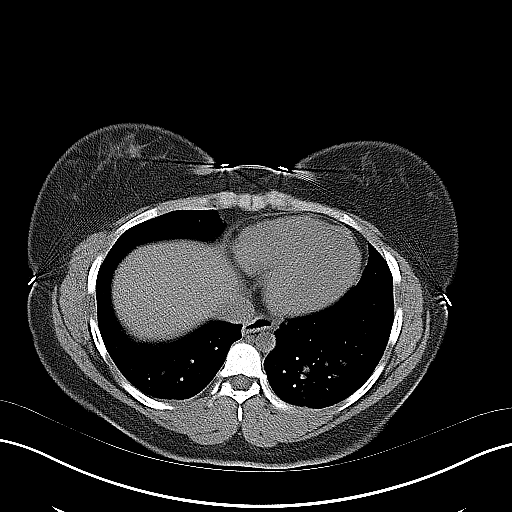
[im 12/19  bone]
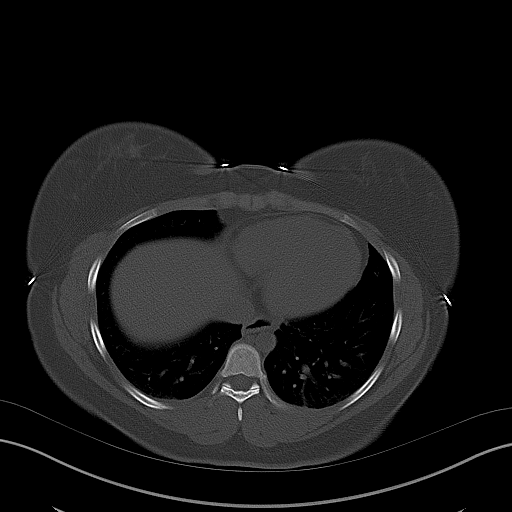
[im 13/19  soft-tissue]
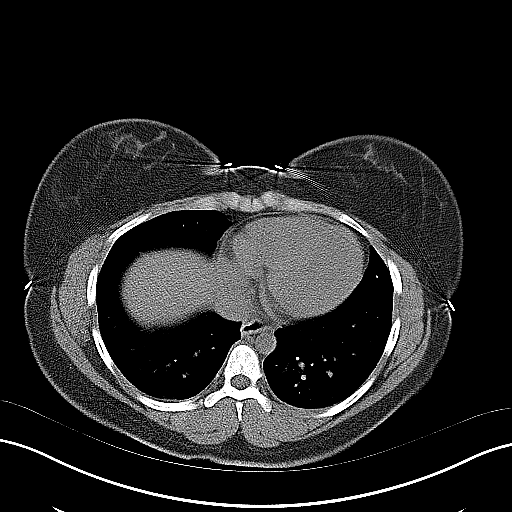
[im 14/19  soft-tissue]
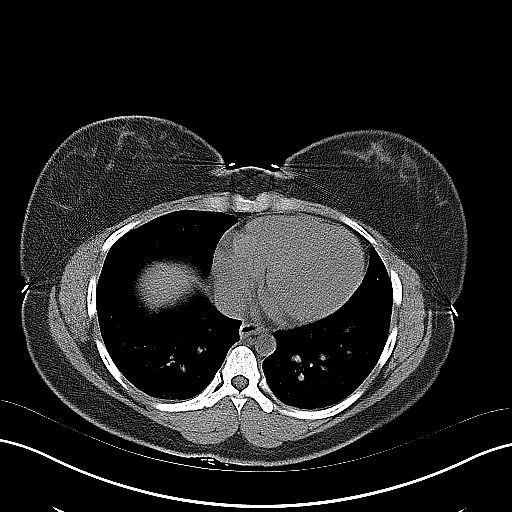
[im 15/19  soft-tissue]
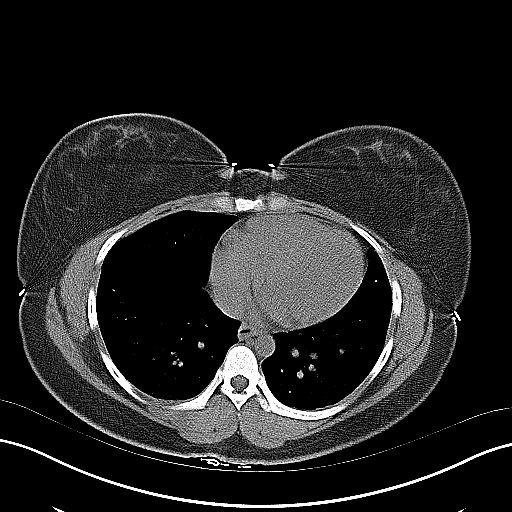
[im 17/19  soft-tissue]
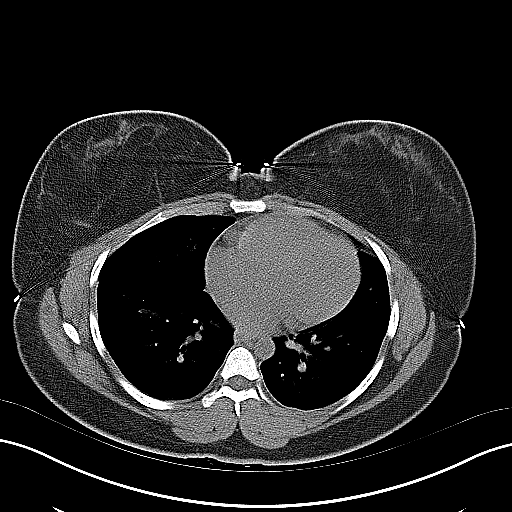
[im 18/19  soft-tissue]
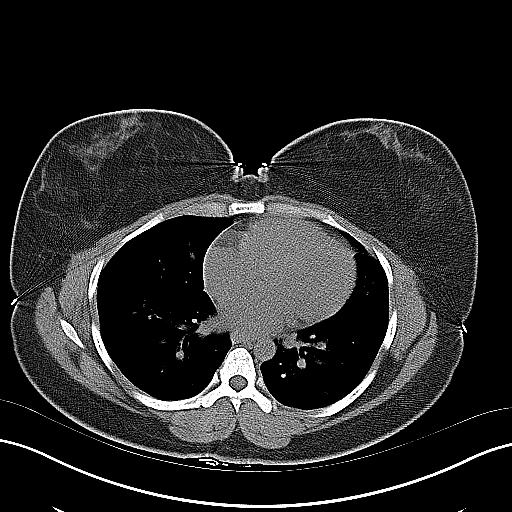

[Series 5: coronal · coronal · 0.83mm/px · 3 of 78 slices shown]
[im 26/78  soft-tissue]
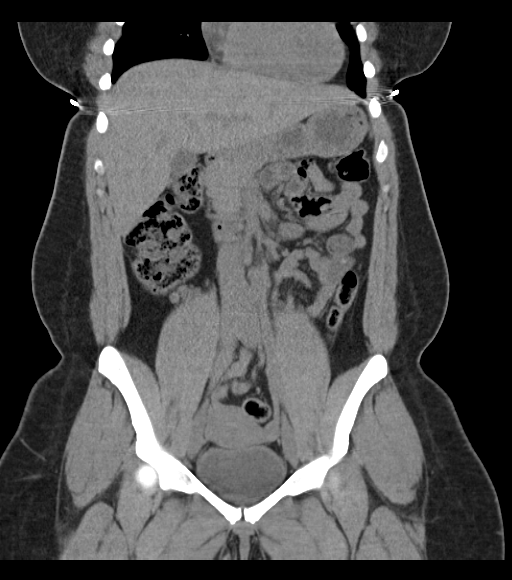
[im 35/78  soft-tissue]
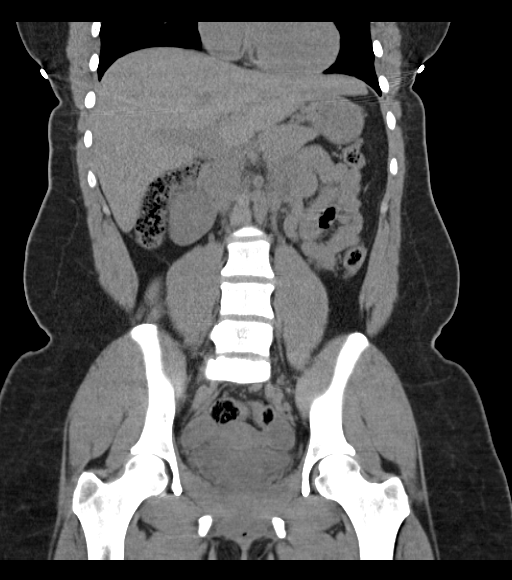
[im 43/78  soft-tissue]
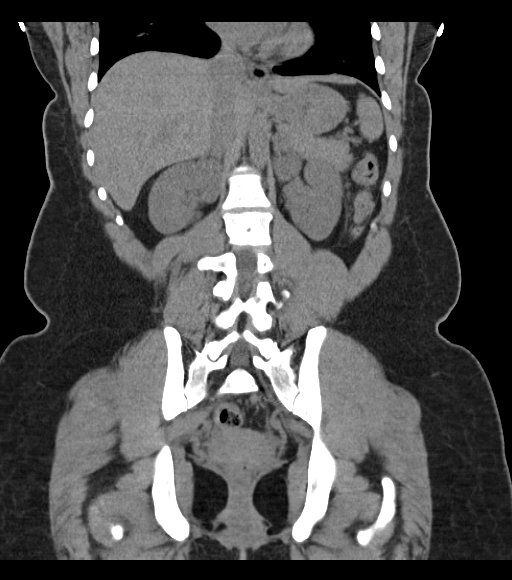

[17 of 33 positions shown; findings below may reference images not displayed]

FINDINGS: Mild dependent changes in the lung bases.

Kidneys are symmetrical in size and shape. No hydronephrosis or
hydroureter. No renal, ureteral, or bladder stones. Bladder wall is
not thickened.

The unenhanced appearance of the liver, spleen, gallbladder,
pancreas, adrenal glands, abdominal aorta, inferior vena cava, and
retroperitoneal lymph nodes is unremarkable. Stomach and small bowel
are decompressed. Stool-filled colon without abnormal distention. No
free air or free fluid in the abdomen. Abdominal wall musculature
appears intact.

Pelvis: Appendix is normal. Uterus and ovaries are not enlarged.
Small amount of free fluid in the pelvis is likely physiologic. No
pelvic mass or lymphadenopathy. Sigmoid colon is unremarkable. No
destructive bone lesions.
IMPRESSION: No renal or ureteral stone or obstruction. Small amount of free
fluid in the pelvis is likely physiologic.

## 2016-10-07 ENCOUNTER — Emergency Department (HOSPITAL_COMMUNITY): Payer: Commercial Managed Care - PPO

## 2016-10-07 ENCOUNTER — Emergency Department (HOSPITAL_COMMUNITY)
Admission: EM | Admit: 2016-10-07 | Discharge: 2016-10-07 | Disposition: A | Discharge status: Home / Self Care | Payer: Commercial Managed Care - PPO | Attending: Emergency Medicine | Admitting: Emergency Medicine

## 2016-10-07 ENCOUNTER — Encounter (HOSPITAL_COMMUNITY): Payer: Self-pay

## 2016-10-07 DIAGNOSIS — Y92410 Unspecified street and highway as the place of occurrence of the external cause: Secondary | ICD-10-CM | POA: Diagnosis not present

## 2016-10-07 DIAGNOSIS — R42 Dizziness and giddiness: Secondary | ICD-10-CM | POA: Insufficient documentation

## 2016-10-07 DIAGNOSIS — Y939 Activity, unspecified: Secondary | ICD-10-CM | POA: Diagnosis not present

## 2016-10-07 DIAGNOSIS — Y999 Unspecified external cause status: Secondary | ICD-10-CM | POA: Diagnosis not present

## 2016-10-07 DIAGNOSIS — Z79899 Other long term (current) drug therapy: Secondary | ICD-10-CM | POA: Diagnosis not present

## 2016-10-07 DIAGNOSIS — R51 Headache: Secondary | ICD-10-CM | POA: Diagnosis not present

## 2016-10-07 MED ORDER — CYCLOBENZAPRINE HCL 10 MG PO TABS: 10.0000 mg | ORAL_TABLET | Freq: Two times a day (BID) | ORAL | 0 refills | Status: DC | PRN

## 2016-10-07 MED ORDER — ACETAMINOPHEN 325 MG PO TABS
650.0000 mg | ORAL_TABLET | Freq: Once | ORAL | Status: AC
  Administered 2016-10-07: 650 mg via ORAL
  Filled 2016-10-07: qty 2
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
## 2016-10-07 NOTE — Discharge Instructions (Signed)
IMPRESSION:  No acute intracranial findings or skull fracture.   Expect to be in some pain and more soreness tomorrow. Use Flexeril twice a day as needed for muscle spasming and pain. Take Tylenol, ibuprofen, or naproxen as needed for pain. Use cold and warm compress to affected areas.  Get help right away if: You have: Numbness, tingling, or weakness in your arms or legs. Severe neck pain, especially tenderness in the middle of the back of your neck. Changes in bowel or bladder control. Increasing pain in any area of your body. Shortness of breath or light-headedness. Chest pain. Blood in your urine, stool, or vomit. Severe pain in your abdomen or your back. Severe or worsening headaches. Sudden vision loss or double vision. Your eye suddenly becomes red. Your pupil is an odd shape or size.

## 2016-10-07 NOTE — ED Notes (Signed)
Patient transported to CT 
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
## 2016-11-20 ENCOUNTER — Other Ambulatory Visit: Payer: Self-pay | Admitting: Obstetrics & Gynecology

## 2016-11-20 LAB — HM PAP SMEAR

## 2016-11-22 LAB — CYTOLOGY - PAP

## 2016-12-03 ENCOUNTER — Encounter: Payer: Self-pay | Admitting: Family Medicine

## 2017-03-26 DIAGNOSIS — N870 Mild cervical dysplasia: Secondary | ICD-10-CM | POA: Insufficient documentation

## 2017-05-23 ENCOUNTER — Encounter: Payer: Self-pay | Admitting: Family Medicine

## 2017-07-01 ENCOUNTER — Encounter: Payer: Self-pay | Admitting: Family Medicine

## 2017-09-12 ENCOUNTER — Encounter: Payer: Self-pay | Admitting: Family Medicine

## 2018-05-17 ENCOUNTER — Ambulatory Visit (INDEPENDENT_AMBULATORY_CARE_PROVIDER_SITE_OTHER): Payer: Managed Care, Other (non HMO)

## 2018-05-17 ENCOUNTER — Ambulatory Visit: Payer: Managed Care, Other (non HMO) | Admitting: Podiatry

## 2018-05-17 ENCOUNTER — Encounter: Payer: Self-pay | Admitting: Podiatry

## 2018-05-17 VITALS — BP 125/73 | HR 80 | Resp 16

## 2018-05-17 DIAGNOSIS — M722 Plantar fascial fibromatosis: Secondary | ICD-10-CM | POA: Diagnosis not present

## 2018-05-17 MED ORDER — TRIAMCINOLONE ACETONIDE 10 MG/ML IJ SUSP
10.0000 mg | Freq: Once | INTRAMUSCULAR | Status: AC
  Administered 2018-05-17: 10 mg

## 2018-05-17 MED ORDER — DICLOFENAC SODIUM 75 MG PO TBEC: 75.0000 mg | DELAYED_RELEASE_TABLET | Freq: Two times a day (BID) | ORAL | 2 refills | Status: DC

## 2018-05-17 NOTE — Patient Instructions (Signed)
For instructions on how to put on your Plantar Fascial Brace, please visit [email or web address]   Plantar Fasciitis (Heel Spur Syndrome) with Rehab The plantar fascia is a fibrous, ligament-like, soft-tissue structure that spans the bottom of the foot. Plantar fasciitis is a condition that causes pain in the foot due to inflammation of the tissue. SYMPTOMS   Pain and tenderness on the underneath side of the foot.  Pain that worsens with standing or walking. CAUSES  Plantar fasciitis is caused by irritation and injury to the plantar fascia on the underneath side of the foot. Common mechanisms of injury include:  Direct trauma to bottom of the foot.  Damage to a small nerve that runs under the foot where the main fascia attaches to the heel bone.  Stress placed on the plantar fascia due to bone spurs. RISK INCREASES WITH:   Activities that place stress on the plantar fascia (running, jumping, pivoting, or cutting).  Poor strength and flexibility.  Improperly fitted shoes.  Tight calf muscles.  Flat feet.  Failure to warm-up properly before activity.  Obesity. PREVENTION  Warm up and stretch properly before activity.  Allow for adequate recovery between workouts.  Maintain physical fitness:  Strength, flexibility, and endurance.  Cardiovascular fitness.  Maintain a health body weight.  Avoid stress on the plantar fascia.  Wear properly fitted shoes, including arch supports for individuals who have flat feet.  PROGNOSIS  If treated properly, then the symptoms of plantar fasciitis usually resolve without surgery. However, occasionally surgery is necessary.  RELATED COMPLICATIONS   Recurrent symptoms that may result in a chronic condition.  Problems of the lower back that are caused by compensating for the injury, such as limping.  Pain or weakness of the foot during push-off following surgery.  Chronic inflammation, scarring, and partial or complete  fascia tear, occurring more often from repeated injections.  TREATMENT  Treatment initially involves the use of ice and medication to help reduce pain and inflammation. The use of strengthening and stretching exercises may help reduce pain with activity, especially stretches of the Achilles tendon. These exercises may be performed at home or with a therapist. Your caregiver may recommend that you use heel cups of arch supports to help reduce stress on the plantar fascia. Occasionally, corticosteroid injections are given to reduce inflammation. If symptoms persist for greater than 6 months despite non-surgical (conservative), then surgery may be recommended.   MEDICATION   If pain medication is necessary, then nonsteroidal anti-inflammatory medications, such as aspirin and ibuprofen, or other minor pain relievers, such as acetaminophen, are often recommended.  Do not take pain medication within 7 days before surgery.  Prescription pain relievers may be given if deemed necessary by your caregiver. Use only as directed and only as much as you need.  Corticosteroid injections may be given by your caregiver. These injections should be reserved for the most serious cases, because they may only be given a certain number of times.  HEAT AND COLD  Cold treatment (icing) relieves pain and reduces inflammation. Cold treatment should be applied for 10 to 15 minutes every 2 to 3 hours for inflammation and pain and immediately after any activity that aggravates your symptoms. Use ice packs or massage the area with a piece of ice (ice massage).  Heat treatment may be used prior to performing the stretching and strengthening activities prescribed by your caregiver, physical therapist, or athletic trainer. Use a heat pack or soak the injury in warm water.  SEEK IMMEDIATE MEDICAL   CARE IF:  Treatment seems to offer no benefit, or the condition worsens.  Any medications produce adverse side effects.   EXERCISES- RANGE OF MOTION (ROM) AND STRETCHING EXERCISES - Plantar Fasciitis (Heel Spur Syndrome) These exercises may help you when beginning to rehabilitate your injury. Your symptoms may resolve with or without further involvement from your physician, physical therapist or athletic trainer. While completing these exercises, remember:   Restoring tissue flexibility helps normal motion to return to the joints. This allows healthier, less painful movement and activity.  An effective stretch should be held for at least 30 seconds.  A stretch should never be painful. You should only feel a gentle lengthening or release in the stretched tissue.  RANGE OF MOTION - Toe Extension, Flexion  Sit with your right / left leg crossed over your opposite knee.  Grasp your toes and gently pull them back toward the top of your foot. You should feel a stretch on the bottom of your toes and/or foot.  Hold this stretch for 10 seconds.  Now, gently pull your toes toward the bottom of your foot. You should feel a stretch on the top of your toes and or foot.  Hold this stretch for 10 seconds. Repeat  times. Complete this stretch 3 times per day.   RANGE OF MOTION - Ankle Dorsiflexion, Active Assisted  Remove shoes and sit on a chair that is preferably not on a carpeted surface.  Place right / left foot under knee. Extend your opposite leg for support.  Keeping your heel down, slide your right / left foot back toward the chair until you feel a stretch at your ankle or calf. If you do not feel a stretch, slide your bottom forward to the edge of the chair, while still keeping your heel down.  Hold this stretch for 10 seconds. Repeat 3 times. Complete this stretch 2 times per day.   STRETCH  Gastroc, Standing  Place hands on wall.  Extend right / left leg, keeping the front knee somewhat bent.  Slightly point your toes inward on your back foot.  Keeping your right / left heel on the floor and your  knee straight, shift your weight toward the wall, not allowing your back to arch.  You should feel a gentle stretch in the right / left calf. Hold this position for 10 seconds. Repeat 3 times. Complete this stretch 2 times per day.  STRETCH  Soleus, Standing  Place hands on wall.  Extend right / left leg, keeping the other knee somewhat bent.  Slightly point your toes inward on your back foot.  Keep your right / left heel on the floor, bend your back knee, and slightly shift your weight over the back leg so that you feel a gentle stretch deep in your back calf.  Hold this position for 10 seconds. Repeat 3 times. Complete this stretch 2 times per day.  STRETCH  Gastrocsoleus, Standing  Note: This exercise can place a lot of stress on your foot and ankle. Please complete this exercise only if specifically instructed by your caregiver.   Place the ball of your right / left foot on a step, keeping your other foot firmly on the same step.  Hold on to the wall or a rail for balance.  Slowly lift your other foot, allowing your body weight to press your heel down over the edge of the step.  You should feel a stretch in your right / left calf.  Hold this   position for 10 seconds.  Repeat this exercise with a slight bend in your right / left knee. Repeat 3 times. Complete this stretch 2 times per day.   STRENGTHENING EXERCISES - Plantar Fasciitis (Heel Spur Syndrome)  These exercises may help you when beginning to rehabilitate your injury. They may resolve your symptoms with or without further involvement from your physician, physical therapist or athletic trainer. While completing these exercises, remember:   Muscles can gain both the endurance and the strength needed for everyday activities through controlled exercises.  Complete these exercises as instructed by your physician, physical therapist or athletic trainer. Progress the resistance and repetitions only as guided.  STRENGTH -  Towel Curls  Sit in a chair positioned on a non-carpeted surface.  Place your foot on a towel, keeping your heel on the floor.  Pull the towel toward your heel by only curling your toes. Keep your heel on the floor. Repeat 3 times. Complete this exercise 2 times per day.  STRENGTH - Ankle Inversion  Secure one end of a rubber exercise band/tubing to a fixed object (table, pole). Loop the other end around your foot just before your toes.  Place your fists between your knees. This will focus your strengthening at your ankle.  Slowly, pull your big toe up and in, making sure the band/tubing is positioned to resist the entire motion.  Hold this position for 10 seconds.  Have your muscles resist the band/tubing as it slowly pulls your foot back to the starting position. Repeat 3 times. Complete this exercises 2 times per day.  Document Released: 08/20/2005 Document Revised: 11/12/2011 Document Reviewed: 12/02/2008 [employer] Patient Information 2014 [employer]. 

## 2018-05-17 NOTE — Progress Notes (Signed)
Subjective:   Patient ID: Jennifer Curtis, female   DOB: 27 y.o.   MRN: [id number]   HPI Patient presents with intense discomfort plantar aspect heel region bilateral of several months duration and does not remember specific injury.  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  Patient does not smoke and likes to be active   Review of Systems  All other systems reviewed and are negative.       Objective:  Physical Exam  Constitutional: She appears well-developed and well-nourished.  Cardiovascular: Intact distal pulses.  Pulmonary/Chest: Effort normal.  Musculoskeletal: Normal range of motion.  Neurological: She is alert.  Skin: Skin is warm.  Nursing note and vitals reviewed.   Neurovascular status intact muscle strength adequate range of motion within normal limits with patient noted to have discomfort in the plantar aspect of the heel region bilateral with inflammation fluid around the medial band and pain with palpation.  Patient is noted to have good digital perfusion well oriented x3 and does have flattening the arch noted bilateral     Assessment:  Acute plantar fasciitis bilateral with arch flattening as complicating factor and pain with palpation to the fascial band at its insertion     Plan:  H&P condition reviewed and today I injected the plantar fascia at its insertion after sterile prep of each foot with 3 mg Kenalog 5 mg Xylocaine.  I applied fascial brace bilateral placed on diclofenac 75 mg twice daily gave instructions for physical therapy shoe gear modifications and will be seen back  X-ray indicates moderate depression the arch and did not indicate spur or stress fracture condition

## 2018-07-08 ENCOUNTER — Emergency Department (HOSPITAL_COMMUNITY): Payer: Managed Care, Other (non HMO)

## 2018-07-08 ENCOUNTER — Emergency Department (HOSPITAL_COMMUNITY)
Admission: EM | Admit: 2018-07-08 | Discharge: 2018-07-09 | Disposition: A | Discharge status: Home / Self Care | Payer: Managed Care, Other (non HMO) | Attending: Emergency Medicine | Admitting: Emergency Medicine

## 2018-07-08 ENCOUNTER — Other Ambulatory Visit: Payer: Self-pay

## 2018-07-08 ENCOUNTER — Encounter (HOSPITAL_COMMUNITY): Payer: Self-pay

## 2018-07-08 DIAGNOSIS — R079 Chest pain, unspecified: Secondary | ICD-10-CM | POA: Insufficient documentation

## 2018-07-08 DIAGNOSIS — Z79899 Other long term (current) drug therapy: Secondary | ICD-10-CM | POA: Diagnosis not present

## 2018-07-08 LAB — POCT I-STAT, CHEM 8
BUN: 13 mg/dL (ref 6–20)
Calcium, Ion: 1.19 mmol/L (ref 1.15–1.40)
Chloride: 104 mmol/L (ref 98–111)
Creatinine, Ser: 0.9 mg/dL (ref 0.44–1.00)
Glucose, Bld: 78 mg/dL (ref 70–99)
HEMATOCRIT: 33 % — AB (ref 36.0–46.0)
HEMOGLOBIN: 11.2 g/dL — AB (ref 12.0–15.0)
Potassium: 3.7 mmol/L (ref 3.5–5.1)
SODIUM: 140 mmol/L (ref 135–145)
TCO2: 26 mmol/L (ref 22–32)

## 2018-07-08 LAB — I-STAT BETA HCG BLOOD, ED (NOT ORDERABLE): I-stat hCG, quantitative: <5 m[IU]/mL (ref <5)

## 2018-07-08 LAB — TROPONIN I: Troponin I: <0.03 ng/mL (ref <0.03)

## 2018-07-08 LAB — D-DIMER, QUANTITATIVE: D-Dimer, Quant: 0.61 ug/mL-FEU — ABNORMAL HIGH (ref 0.00–0.50)

## 2018-07-08 MED ORDER — SODIUM CHLORIDE 0.9 % IJ SOLN
INTRAMUSCULAR | Status: AC
  Filled 2018-07-08: qty 50

## 2018-07-08 MED ORDER — IOPAMIDOL (ISOVUE-370) INJECTION 76%
100.0000 mL | Freq: Once | INTRAVENOUS | Status: AC | PRN
  Administered 2018-07-08: 100 mL via INTRAVENOUS

## 2018-07-08 MED ORDER — IOPAMIDOL (ISOVUE-370) INJECTION 76%
INTRAVENOUS | Status: AC
  Filled 2018-07-08: qty 100

## 2018-07-08 NOTE — ED Triage Notes (Signed)
She c/o right-sided chest pain which began ~ 2 weeks ago. She states the pain has gradually expanded to include left chest area. She denies any sign of current illness and denies hx of asthma. She states the pain is made a bit worse while lifting things. Her skin is normal, warm and dry and she is breathing normally.
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
## 2018-07-09 MED ORDER — ACETAMINOPHEN 325 MG PO TABS
650.0000 mg | ORAL_TABLET | Freq: Once | ORAL | Status: AC
  Administered 2018-07-09: 650 mg via ORAL
  Filled 2018-07-09: qty 2

## 2020-02-17 ENCOUNTER — Other Ambulatory Visit: Payer: Self-pay

## 2020-06-09 ENCOUNTER — Other Ambulatory Visit: Payer: Self-pay | Admitting: Obstetrics and Gynecology

## 2020-06-09 DIAGNOSIS — O132 Gestational [pregnancy-induced] hypertension without significant proteinuria, second trimester: Secondary | ICD-10-CM

## 2020-06-10 ENCOUNTER — Encounter (HOSPITAL_COMMUNITY): Payer: Self-pay | Admitting: Obstetrics and Gynecology

## 2020-06-10 ENCOUNTER — Other Ambulatory Visit: Payer: Self-pay | Admitting: Obstetrics and Gynecology

## 2020-06-10 ENCOUNTER — Ambulatory Visit: Payer: Medicaid Other | Attending: Obstetrics and Gynecology | Admitting: Obstetrics and Gynecology

## 2020-06-10 ENCOUNTER — Other Ambulatory Visit: Payer: Self-pay

## 2020-06-10 ENCOUNTER — Inpatient Hospital Stay (HOSPITAL_COMMUNITY)
Admission: AD | Admit: 2020-06-10 | Discharge: 2020-06-19 | DRG: 788 | Disposition: A | Discharge status: Home / Self Care | Payer: BC Managed Care – PPO | Attending: Obstetrics & Gynecology | Admitting: Obstetrics & Gynecology

## 2020-06-10 ENCOUNTER — Ambulatory Visit: Payer: BC Managed Care – PPO | Admitting: *Deleted

## 2020-06-10 ENCOUNTER — Ambulatory Visit (HOSPITAL_BASED_OUTPATIENT_CLINIC_OR_DEPARTMENT_OTHER): Payer: BC Managed Care – PPO

## 2020-06-10 VITALS — BP 172/113 | HR 76

## 2020-06-10 DIAGNOSIS — O132 Gestational [pregnancy-induced] hypertension without significant proteinuria, second trimester: Secondary | ICD-10-CM

## 2020-06-10 DIAGNOSIS — Z3A27 27 weeks gestation of pregnancy: Secondary | ICD-10-CM | POA: Diagnosis not present

## 2020-06-10 DIAGNOSIS — O1414 Severe pre-eclampsia complicating childbirth: Secondary | ICD-10-CM | POA: Diagnosis present

## 2020-06-10 DIAGNOSIS — O1492 Unspecified pre-eclampsia, second trimester: Secondary | ICD-10-CM

## 2020-06-10 DIAGNOSIS — O365920 Maternal care for other known or suspected poor fetal growth, second trimester, not applicable or unspecified: Principal | ICD-10-CM

## 2020-06-10 DIAGNOSIS — O365990 Maternal care for other known or suspected poor fetal growth, unspecified trimester, not applicable or unspecified: Secondary | ICD-10-CM

## 2020-06-10 DIAGNOSIS — Z363 Encounter for antenatal screening for malformations: Secondary | ICD-10-CM

## 2020-06-10 DIAGNOSIS — O99214 Obesity complicating childbirth: Secondary | ICD-10-CM | POA: Diagnosis present

## 2020-06-10 DIAGNOSIS — Z20822 Contact with and (suspected) exposure to covid-19: Secondary | ICD-10-CM | POA: Diagnosis present

## 2020-06-10 DIAGNOSIS — O139 Gestational [pregnancy-induced] hypertension without significant proteinuria, unspecified trimester: Secondary | ICD-10-CM | POA: Diagnosis present

## 2020-06-10 HISTORY — DX: Gestational (pregnancy-induced) hypertension without significant proteinuria, unspecified trimester: O13.9

## 2020-06-10 HISTORY — DX: Unspecified abnormal cytological findings in specimens from vagina: R87.629

## 2020-06-10 LAB — COMPREHENSIVE METABOLIC PANEL
ALT: 32 U/L (ref 0–44)
AST: 31 U/L (ref 15–41)
Albumin: 2.7 g/dL — ABNORMAL LOW (ref 3.5–5.0)
Alkaline Phosphatase: 73 U/L (ref 38–126)
Anion gap: 10 (ref 5–15)
BUN: 12 mg/dL (ref 6–20)
CO2: 23 mmol/L (ref 22–32)
Calcium: 8.7 mg/dL — ABNORMAL LOW (ref 8.9–10.3)
Chloride: 105 mmol/L (ref 98–111)
Creatinine, Ser: 0.84 mg/dL (ref 0.44–1.00)
GFR, Estimated: >60 mL/min (ref >60)
Glucose, Bld: 111 mg/dL — ABNORMAL HIGH (ref 70–99)
Potassium: 3.8 mmol/L (ref 3.5–5.1)
Sodium: 138 mmol/L (ref 135–145)
Total Bilirubin: 0.2 mg/dL — ABNORMAL LOW (ref 0.3–1.2)
Total Protein: 6 g/dL — ABNORMAL LOW (ref 6.5–8.1)

## 2020-06-10 LAB — URINALYSIS, ROUTINE W REFLEX MICROSCOPIC
Bilirubin Urine: NEGATIVE
Glucose, UA: NEGATIVE mg/dL
Hgb urine dipstick: NEGATIVE
Ketones, ur: NEGATIVE mg/dL
Leukocytes,Ua: NEGATIVE
Nitrite: NEGATIVE
Protein, ur: 100 mg/dL — AB
Specific Gravity, Urine: 1.023 (ref 1.005–1.030)
pH: 5 (ref 5.0–8.0)

## 2020-06-10 LAB — PROTEIN / CREATININE RATIO, URINE
Creatinine, Urine: 238.85 mg/dL
Protein Creatinine Ratio: 1.98 mg/mg{Cre} — ABNORMAL HIGH (ref 0.00–0.15)
Total Protein, Urine: 473 mg/dL

## 2020-06-10 LAB — CBC
HCT: 39 % (ref 36.0–46.0)
Hemoglobin: 13 g/dL (ref 12.0–15.0)
MCH: 30.4 pg (ref 26.0–34.0)
MCHC: 33.3 g/dL (ref 30.0–36.0)
MCV: 91.1 fL (ref 80.0–100.0)
Platelets: 228 10*3/uL (ref 150–400)
RBC: 4.28 MIL/uL (ref 3.87–5.11)
RDW: 13.3 % (ref 11.5–15.5)
WBC: 11 10*3/uL — ABNORMAL HIGH (ref 4.0–10.5)
nRBC: 0 % (ref 0.0–0.2)

## 2020-06-10 LAB — RESPIRATORY PANEL BY RT PCR (FLU A&B, COVID)
Influenza A by PCR: NEGATIVE
Influenza B by PCR: NEGATIVE
SARS Coronavirus 2 by RT PCR: NEGATIVE

## 2020-06-10 LAB — TYPE AND SCREEN
ABO/RH(D): A POS
Antibody Screen: NEGATIVE

## 2020-06-10 MED ORDER — LABETALOL HCL 5 MG/ML IV SOLN
80.0000 mg | INTRAVENOUS | Status: DC | PRN
  Administered 2020-06-14: 80 mg via INTRAVENOUS
  Filled 2020-06-10: qty 16

## 2020-06-10 MED ORDER — LABETALOL HCL 5 MG/ML IV SOLN
20.0000 mg | INTRAVENOUS | Status: DC | PRN
  Administered 2020-06-14: 20 mg via INTRAVENOUS
  Filled 2020-06-10: qty 4

## 2020-06-10 MED ORDER — CALCIUM CARBONATE ANTACID 500 MG PO CHEW
2.0000 | CHEWABLE_TABLET | ORAL | Status: DC | PRN
  Administered 2020-06-14 – 2020-06-18 (×3): 400 mg via ORAL
  Filled 2020-06-10 (×3): qty 2

## 2020-06-10 MED ORDER — ZOLPIDEM TARTRATE 5 MG PO TABS: 5.0000 mg | ORAL_TABLET | Freq: Every evening | ORAL | Status: DC | PRN

## 2020-06-10 MED ORDER — PRENATAL MULTIVITAMIN CH
1.0000 | ORAL_TABLET | Freq: Every day | ORAL | Status: DC
  Administered 2020-06-11 – 2020-06-19 (×9): 1 via ORAL
  Filled 2020-06-10 (×9): qty 1

## 2020-06-10 MED ORDER — HYDRALAZINE HCL 20 MG/ML IJ SOLN: 10.0000 mg | INTRAMUSCULAR | Status: DC | PRN

## 2020-06-10 MED ORDER — HYDRALAZINE HCL 20 MG/ML IJ SOLN
10.0000 mg | INTRAMUSCULAR | Status: DC | PRN
  Administered 2020-06-14: 10 mg via INTRAVENOUS
  Filled 2020-06-10: qty 1

## 2020-06-10 MED ORDER — SODIUM CHLORIDE 0.9% FLUSH
3.0000 mL | Freq: Two times a day (BID) | INTRAVENOUS | Status: DC
  Administered 2020-06-11 – 2020-06-16 (×8): 3 mL via INTRAVENOUS

## 2020-06-10 MED ORDER — MAGNESIUM SULFATE BOLUS VIA INFUSION
4.0000 g | Freq: Once | INTRAVENOUS | Status: AC
  Administered 2020-06-10: 4 g via INTRAVENOUS
  Filled 2020-06-10: qty 1000

## 2020-06-10 MED ORDER — LABETALOL HCL 5 MG/ML IV SOLN: 80.0000 mg | INTRAVENOUS | Status: DC | PRN

## 2020-06-10 MED ORDER — LABETALOL HCL 5 MG/ML IV SOLN
40.0000 mg | INTRAVENOUS | Status: DC | PRN
  Administered 2020-06-14: 40 mg via INTRAVENOUS
  Filled 2020-06-10: qty 8

## 2020-06-10 MED ORDER — LABETALOL HCL 5 MG/ML IV SOLN: 20.0000 mg | INTRAVENOUS | Status: DC | PRN

## 2020-06-10 MED ORDER — LACTATED RINGERS IV SOLN
INTRAVENOUS | Status: DC
  Administered 2020-06-16: 75 mL/h via INTRAVENOUS

## 2020-06-10 MED ORDER — SODIUM CHLORIDE 0.9 % IV SOLN: 250.0000 mL | INTRAVENOUS | Status: DC | PRN

## 2020-06-10 MED ORDER — BETAMETHASONE SOD PHOS & ACET 6 (3-3) MG/ML IJ SUSP
12.0000 mg | INTRAMUSCULAR | Status: AC
  Administered 2020-06-10 – 2020-06-11 (×2): 12 mg via INTRAMUSCULAR
  Filled 2020-06-10 (×2): qty 5

## 2020-06-10 MED ORDER — SODIUM CHLORIDE 0.9% FLUSH: 3.0000 mL | INTRAVENOUS | Status: DC | PRN

## 2020-06-10 MED ORDER — LABETALOL HCL 5 MG/ML IV SOLN: 40.0000 mg | INTRAVENOUS | Status: DC | PRN

## 2020-06-10 MED ORDER — DOCUSATE SODIUM 100 MG PO CAPS
100.0000 mg | ORAL_CAPSULE | Freq: Every day | ORAL | Status: DC
  Administered 2020-06-11 – 2020-06-19 (×7): 100 mg via ORAL
  Filled 2020-06-10 (×9): qty 1

## 2020-06-10 MED ORDER — ACETAMINOPHEN 325 MG PO TABS
650.0000 mg | ORAL_TABLET | ORAL | Status: DC | PRN
  Administered 2020-06-14 – 2020-06-16 (×3): 650 mg via ORAL
  Filled 2020-06-10 (×3): qty 2

## 2020-06-10 MED ORDER — MAGNESIUM SULFATE 40 GM/1000ML IV SOLN
2.0000 g/h | INTRAVENOUS | Status: DC
  Filled 2020-06-10: qty 1000

## 2020-06-10 NOTE — MAU Note (Signed)
Pt sent from MFM office for further evaluation and possible admission due to Hypertension, doppler flow studies and fetal growth delay. Pt is G1P0 at 27.3 days with an EDC of 09/06/2020. Pr denies SROM, vaginal bleeding or bloody show. Endorses + fetal movement. Pt denies HA, visual changes, or epigastric pain. External fetal and labor monitors applied.
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
## 2020-06-10 NOTE — Progress Notes (Signed)
Pt sent to MAU for further evaluation after being hypertensive in MFM today per Dr. Judeth Cornfield.  Pt was completely at rest with BP 172/113 and repeat 142/109.  Pt has +2 pitting edema in feet/ankles and complains hands are swollen.  Denies any other preeclampsia S&S.  Good fetal movement.

## 2020-06-10 NOTE — Progress Notes (Signed)
Maternal-Fetal Medicine   Name: Jennifer Curtis DOB: May 08, 1991 MRN: [id number] Referring Provider: Nigel Bridgeman, CNM  I had the pleasure of seeing Ms. Jennifer Curtis today at the [facility].  She is here for ultrasound and consultation. She has a diagnosis of gestational hypertension and fetal growth restriction.  On your office ultrasound performed 2 days ago, severe fetal growth restriction with abnormal umbilical artery (UA) study was seen. Patient had increased blood pressures about a week back, Her most-recent blood pressure at your office was 124/80 mm Hg.  Her prenatal course has been, otherwise, uneventful.  She does not have gestational diabetes.  Fetal anatomical survey performed at your office was normal.  Past medical history: No history of hypertension or diabetes or any other chronic medical conditions. Past surgical history: Tonsillectomy in childhood. Medications: Prenatal vitamins, low-dose aspirin (recently started). Allergies: No known drug allergies. Social history: Denies tobacco drug or alcohol use.  She is a Occupational psychologist in a company.  She has been married 1 year and her husband is in good health. Family history: No history of venous thromboembolism in the family.  Mother has hypertension.  She does not know her dad's condition well. GYN history: History of abnormal Pap smears in the past.  No history of cervical surgeries.  No history of breast disease.  Her previous menstrual cycles were regular.  Today her blood pressures where 172/113 and 142/109 mmHg.  On today's ultrasound, amniotic fluid is normal and good fetal activity is seen.  Cephalic presentation.  Severe fetal growth restriction is seen with the estimated fetal weight at less than the 1st percentile.  Head circumference measurement is between -3 and -2 SD.  Abdominal circumference measurement is at the 3rd percentile.  Good fetal breathing and tone are seen.  BPP 8/8.  Umbilical artery  Doppler showed intermittent absent end-diastolic flow (mostly absent end-diastolic flow).  Our concerns include: Gestational hypertension/preeclampsia -Based on the severe range blood pressure, the diagnosis is most likely severe gestational hypertension/preeclampsia.  We need series of blood pressures to confirm the diagnosis. -I explained the diagnosis of gestational hypertension/preeclampsia and its possible maternal and fetal complications.  Maternal complications include stroke, eclampsia, pulmonary edema, renal failure, coagulation disturbances, and placental abruption. -Fetal growth restriction is most likely due to placental insufficiency because of hypertensive complication. -I recommended inpatient management with series of blood pressures and labs. -I discussed antenatal corticosteroids and its benefits. -Explained fetal growth restriction and abnormal Doppler studies.  If absent end-diastolic flow persist, we would recommend delivery at [redacted] weeks gestation. -I discussed timing of delivery.  If preeclampsia with severe features are confirmed, we would recommend inpatient management till delivery at [redacted] weeks gestation. -Magnesium sulfate for eclampsia prophylaxis or before preterm delivery may be given.  Recommendations -Evaluate in the MAU and admit Ob Specialty Care if indicated. -Betamethasone course. -Labs as per protocol. -Daily NST. -Twice-weekly BPP and UA Doppler studies from 28 weeks. -Inpatient management if preeclampsia with severe features is confirmed. -Start oral antihypertensives if SBP is ?155 and/or DBP?105 mm Hg. Delivery is indicated (not limited to) for following reasons:  -Persistent and uncontrolled hypertension not responding to antihypertensives  -Abnormal labs including increasing liver enzymes or thrombocytopenia (<100,000).  -Non-reassuring fetal heart trace.  -Oligohydramnios or abnormal antenatal testings.  -Oliguria (hourly output 20 mL to 30 mL).   -Increasing creatinine (?1.1) Severe symptoms of headache or visual spots or persistent right upper quadrant pain.  -Placental abruption.  I discussed with MAU Team Steward Drone,  CNM). Patient will be going over to the MAU.  Thank you for consultation. If you have any questions or concerns, please contact me at the [facility]. Consultation including face-to-face counseling: 45 minutes.

## 2020-06-10 NOTE — MAU Provider Note (Signed)
Chief Complaint  Patient presents with  . Hypertension     First Provider Initiated Contact with Patient 06/10/20 1348      S: Jennifer Curtis  is a 29 y.o. y.o. year old G1P0 female at [redacted]w[redacted]d weeks gestation who was sent over from MFM for severe range BPs in the office this morning. Hx of gestational hypertension not currently on blood pressure medication.   Dr Judeth Cornfield notified CNM that BP was 172/113 and 142/109 this morning in MFM and recommends admission to hospital for BMZ and BP management.  She denies any associated symptoms such as HA, vision changes or epigastric pain. Patient denies contractions, vaginal bleeding. Reports +FM.   Patient reports having a severe range BP at home last Monday or Tuesday which was 170s/100 but she did not think it was correct so she did not state elevated BP to anyone.   Pregnancy is complicated by severe FGR, GHTN and GDM.    O:  Patient Vitals for the past 24 hrs:  BP Temp Temp src Pulse Resp SpO2 Height Weight  06/10/20 1500 140/84 -- -- 73 -- 99 % -- --  06/10/20 1455 -- -- -- -- -- 99 % -- --  06/10/20 1450 -- -- -- -- -- 99 % -- --  06/10/20 1446 (!) 146/94 -- -- 83 -- -- -- --  06/10/20 1445 -- -- -- -- -- 99 % -- --  06/10/20 1440 -- -- -- -- -- 99 % -- --  06/10/20 1435 -- -- -- -- -- 99 % -- --  06/10/20 1431 (!) 147/89 -- -- 86 -- -- -- --  06/10/20 1430 -- -- -- -- -- 99 % -- --  06/10/20 1425 -- -- -- -- -- 99 % -- --  06/10/20 1420 -- -- -- -- -- 98 % -- --  06/10/20 1416 (!) 145/95 -- -- 86 -- -- -- --  06/10/20 1415 -- -- -- -- -- 99 % -- --  06/10/20 1405 -- -- -- -- -- 100 % -- --  06/10/20 1401 135/84 -- -- 84 -- -- -- --  06/10/20 1400 -- -- -- -- -- 99 % -- --  06/10/20 1355 (!) 150/91 -- -- 89 -- 98 % -- --  06/10/20 1350 -- -- -- -- -- 98 % -- --  06/10/20 1345 -- -- -- -- -- 96 % -- --  06/10/20 1340 -- -- -- -- -- 97 % -- --  06/10/20 1335 -- -- -- -- -- 97 % -- --  06/10/20 1330 -- -- -- -- -- 99 % -- --   06/10/20 1319 (!) 149/92 98.3 F (36.8 C) Oral 87 19 -- -- --  06/10/20 1241 (!) 144/102 98.6 F (37 C) Oral 92 16 97 % 5' (1.524 m) 110.2 kg   General: NAD Heart: Regular rate Lungs: Normal rate and effort Abd: Soft, NT, Gravid, S=D Extremities: no pedal edema Neuro: 2+ deep tendon reflexes, No clonus Pelvic: NEFG, no bleeding or LOF.      Fetal monitoring:  145/minimal-moderate/+accels/ one variable deceleration  No UC   Results for orders placed or performed during the hospital encounter of 06/10/20 (from the past 24 hour(s))  CBC     Status: Abnormal   Collection Time: 06/10/20 12:37 PM  Result Value Ref Range   WBC 11.0 (H) 4.0 - 10.5 K/uL   RBC 4.28 3.87 - 5.11 MIL/uL   Hemoglobin 13.0 12.0 - 15.0 g/dL   HCT 40.0 36 -  46 %   MCV 91.1 80.0 - 100.0 fL   MCH 30.4 26.0 - 34.0 pg   MCHC 33.3 30.0 - 36.0 g/dL   RDW 32.2 02.5 - 42.7 %   Platelets 228 150 - 400 K/uL   nRBC 0.0 0.0 - 0.2 %  Comprehensive metabolic panel     Status: Abnormal   Collection Time: 06/10/20 12:37 PM  Result Value Ref Range   Sodium 138 135 - 145 mmol/L   Potassium 3.8 3.5 - 5.1 mmol/L   Chloride 105 98 - 111 mmol/L   CO2 23 22 - 32 mmol/L   Glucose, Bld 111 (H) 70 - 99 mg/dL   BUN 12 6 - 20 mg/dL   Creatinine, Ser 0.62 0.44 - 1.00 mg/dL   Calcium 8.7 (L) 8.9 - 10.3 mg/dL   Total Protein 6.0 (L) 6.5 - 8.1 g/dL   Albumin 2.7 (L) 3.5 - 5.0 g/dL   AST 31 15 - 41 U/L   ALT 32 0 - 44 U/L   Alkaline Phosphatase 73 38 - 126 U/L   Total Bilirubin 0.2 (L) 0.3 - 1.2 mg/dL   GFR, Estimated >37 >62 mL/min   Anion gap 10 5 - 15  Protein / creatinine ratio, urine     Status: Abnormal   Collection Time: 06/10/20 12:58 PM  Result Value Ref Range   Creatinine, Urine 238.85 mg/dL   Total Protein, Urine 473 mg/dL   Protein Creatinine Ratio 1.98 (H) 0.00 - 0.15 mg/mg[Cre]  Urinalysis, Routine w reflex microscopic Urine, Clean Catch     Status: Abnormal   Collection Time: 06/10/20 12:58 PM  Result Value  Ref Range   Color, Urine YELLOW YELLOW   APPearance HAZY (A) CLEAR   Specific Gravity, Urine 1.023 1.005 - 1.030   pH 5.0 5.0 - 8.0   Glucose, UA NEGATIVE NEGATIVE mg/dL   Hgb urine dipstick NEGATIVE NEGATIVE   Bilirubin Urine NEGATIVE NEGATIVE   Ketones, ur NEGATIVE NEGATIVE mg/dL   Protein, ur 831 (A) NEGATIVE mg/dL   Nitrite NEGATIVE NEGATIVE   Leukocytes,Ua NEGATIVE NEGATIVE   RBC / HPF 0-5 0 - 5 RBC/hpf   WBC, UA 0-5 0 - 5 WBC/hpf   Bacteria, UA RARE (A) NONE SEEN   Squamous Epithelial / LPF 11-20 0 - 5   Mucus PRESENT    Lab results discussed with patient. Dr [name] called and message left. Dorisann Frames notified of admission to hospital and recommendations of Dr Judeth Cornfield. Orders for admission placed by [facility] CNM and care taken over.   A: [redacted]w[redacted]d week IUP Preeclampsia FHR reactive  P: Admit to OB Speciality care  Orders placed by [facility] CNM  Care taken over by Georgina Quint, Rudean Curt, CNM 06/10/2020 3:16 PM

## 2020-06-10 NOTE — MAU Note (Signed)
Jennifer Curtis is a 29 y.o. at [redacted]w[redacted]d here in MAU reporting: sent over from MFM for elevated BP. No headaches or visual changes. Had some spotting this AM. No LOF, no pain.  Onset of complaint: today  Pain score: 0/10  Vitals:   06/10/20 1241  BP: (!) 144/102  Pulse: 92  Resp: 16  Temp: 98.6 F (37 C)  SpO2: 97%     FHT: +FM  Lab orders placed from triage: entered by provider

## 2020-06-11 DIAGNOSIS — O1414 Severe pre-eclampsia complicating childbirth: Secondary | ICD-10-CM | POA: Diagnosis present

## 2020-06-11 DIAGNOSIS — O1402 Mild to moderate pre-eclampsia, second trimester: Secondary | ICD-10-CM | POA: Diagnosis not present

## 2020-06-11 DIAGNOSIS — O1413 Severe pre-eclampsia, third trimester: Secondary | ICD-10-CM | POA: Diagnosis not present

## 2020-06-11 DIAGNOSIS — Z20822 Contact with and (suspected) exposure to covid-19: Secondary | ICD-10-CM | POA: Diagnosis present

## 2020-06-11 DIAGNOSIS — O368930 Maternal care for other specified fetal problems, third trimester, not applicable or unspecified: Secondary | ICD-10-CM | POA: Diagnosis not present

## 2020-06-11 DIAGNOSIS — O358XX0 Maternal care for other (suspected) fetal abnormality and damage, not applicable or unspecified: Secondary | ICD-10-CM | POA: Diagnosis not present

## 2020-06-11 DIAGNOSIS — O365930 Maternal care for other known or suspected poor fetal growth, third trimester, not applicable or unspecified: Secondary | ICD-10-CM | POA: Diagnosis not present

## 2020-06-11 DIAGNOSIS — O139 Gestational [pregnancy-induced] hypertension without significant proteinuria, unspecified trimester: Secondary | ICD-10-CM | POA: Diagnosis present

## 2020-06-11 DIAGNOSIS — O1493 Unspecified pre-eclampsia, third trimester: Secondary | ICD-10-CM | POA: Diagnosis not present

## 2020-06-11 DIAGNOSIS — O365920 Maternal care for other known or suspected poor fetal growth, second trimester, not applicable or unspecified: Secondary | ICD-10-CM | POA: Diagnosis present

## 2020-06-11 DIAGNOSIS — O133 Gestational [pregnancy-induced] hypertension without significant proteinuria, third trimester: Secondary | ICD-10-CM | POA: Diagnosis not present

## 2020-06-11 DIAGNOSIS — Z3A27 27 weeks gestation of pregnancy: Secondary | ICD-10-CM | POA: Diagnosis not present

## 2020-06-11 DIAGNOSIS — O99214 Obesity complicating childbirth: Secondary | ICD-10-CM | POA: Diagnosis present

## 2020-06-11 LAB — COMPREHENSIVE METABOLIC PANEL
ALT: 31 U/L (ref 0–44)
AST: 26 U/L (ref 15–41)
Albumin: 2.8 g/dL — ABNORMAL LOW (ref 3.5–5.0)
Alkaline Phosphatase: 81 U/L (ref 38–126)
Anion gap: 10 (ref 5–15)
BUN: 12 mg/dL (ref 6–20)
CO2: 21 mmol/L — ABNORMAL LOW (ref 22–32)
Calcium: 7.7 mg/dL — ABNORMAL LOW (ref 8.9–10.3)
Chloride: 103 mmol/L (ref 98–111)
Creatinine, Ser: 0.88 mg/dL (ref 0.44–1.00)
GFR, Estimated: >60 mL/min (ref >60)
Glucose, Bld: 139 mg/dL — ABNORMAL HIGH (ref 70–99)
Potassium: 4.3 mmol/L (ref 3.5–5.1)
Sodium: 134 mmol/L — ABNORMAL LOW (ref 135–145)
Total Bilirubin: 0.2 mg/dL — ABNORMAL LOW (ref 0.3–1.2)
Total Protein: 6.3 g/dL — ABNORMAL LOW (ref 6.5–8.1)

## 2020-06-11 LAB — CBC
HCT: 41.3 % (ref 36.0–46.0)
Hemoglobin: 13.9 g/dL (ref 12.0–15.0)
MCH: 30.5 pg (ref 26.0–34.0)
MCHC: 33.7 g/dL (ref 30.0–36.0)
MCV: 90.6 fL (ref 80.0–100.0)
Platelets: 297 10*3/uL (ref 150–400)
RBC: 4.56 MIL/uL (ref 3.87–5.11)
RDW: 13.2 % (ref 11.5–15.5)
WBC: 18.4 10*3/uL — ABNORMAL HIGH (ref 4.0–10.5)
nRBC: 0.1 % (ref 0.0–0.2)

## 2020-06-11 LAB — PROTEIN / CREATININE RATIO, URINE
Creatinine, Urine: 216.55 mg/dL
Protein Creatinine Ratio: 1.04 mg/mg{Cre} — ABNORMAL HIGH (ref 0.00–0.15)
Total Protein, Urine: 226 mg/dL

## 2020-06-11 NOTE — Progress Notes (Addendum)
Jennifer Curtis is a 29 y.o. G1P0 at [redacted]w[redacted]d  who is admitted for severe range blood pressures during MFM visit yesterday and IUGR.Marland Kitchen   Fetal presentation is cephalic.  Length of Stay:  1  Days  Subjective: Resting in bed, comfortable. Denies HA/NV/RUQ pain/visual changes.  Spouse present and supportive.   Patient reports good fetal movement.  She reports no uterine contractions, no bleeding and no loss of fluid per vagina.  Vitals:   Patient Vitals for the past 24 hrs:  BP Temp Temp src Pulse Resp SpO2 Height Weight  06/11/20 0753 (!) 141/95 98 F (36.7 C) Oral (!) 101 18 96 % -- --  06/11/20 0405 (!) 135/91 97.8 F (36.6 C) Oral 97 20 96 % -- --  06/11/20 0000 (!) 153/97 -- -- 97 19 94 % -- --  06/10/20 2300 (!) 154/91 -- -- (!) 107 17 96 % -- --  06/10/20 2200 (!) 154/102 -- -- 94 20 98 % -- --  06/10/20 2100 (!) 154/103 -- -- 95 20 98 % -- --  06/10/20 2000 (!) 145/98 -- -- 100 19 99 % -- --  06/10/20 1900 (!) 158/103 98.2 F (36.8 C) Oral 89 17 99 % -- --   Physical Examination: General appearance - alert, well appearing, and in no distress Mental status - alert, oriented to person, place, and time, normal mood, behavior, speech, dress, motor activity, and thought processes Abdomen - soft, nontender, nondistended, no masses or organomegaly, gravid Pelvic - examination not indicated Cervical Exam: Not evaluated. Membranes:intact  Fetal Monitoring:  Baseline: 130 bpm, Variability: Good {> 6 bpm), Accelerations: Non-reactive but appropriate for gestational age and Decelerations: Absent  Labs:  Results for orders placed or performed during the hospital encounter of 06/10/20 (from the past 24 hour(s))  CBC   Collection Time: 06/10/20 12:37 PM  Result Value Ref Range   WBC 11.0 (H) 4.0 - 10.5 K/uL   RBC 4.28 3.87 - 5.11 MIL/uL   Hemoglobin 13.0 12.0 - 15.0 g/dL   HCT 39.7 36 - 46 %   MCV 91.1 80.0 - 100.0 fL   MCH 30.4 26.0 - 34.0 pg   MCHC 33.3 30.0 - 36.0 g/dL   RDW  67.3 41.9 - 37.9 %   Platelets 228 150 - 400 K/uL   nRBC 0.0 0.0 - 0.2 %  Comprehensive metabolic panel   Collection Time: 06/10/20 12:37 PM  Result Value Ref Range   Sodium 138 135 - 145 mmol/L   Potassium 3.8 3.5 - 5.1 mmol/L   Chloride 105 98 - 111 mmol/L   CO2 23 22 - 32 mmol/L   Glucose, Bld 111 (H) 70 - 99 mg/dL   BUN 12 6 - 20 mg/dL   Creatinine, Ser 0.24 0.44 - 1.00 mg/dL   Calcium 8.7 (L) 8.9 - 10.3 mg/dL   Total Protein 6.0 (L) 6.5 - 8.1 g/dL   Albumin 2.7 (L) 3.5 - 5.0 g/dL   AST 31 15 - 41 U/L   ALT 32 0 - 44 U/L   Alkaline Phosphatase 73 38 - 126 U/L   Total Bilirubin 0.2 (L) 0.3 - 1.2 mg/dL   GFR, Estimated >09 >73 mL/min   Anion gap 10 5 - 15  Protein / creatinine ratio, urine   Collection Time: 06/10/20 12:58 PM  Result Value Ref Range   Creatinine, Urine 238.85 mg/dL   Total Protein, Urine 473 mg/dL   Protein Creatinine Ratio 1.98 (H) 0.00 - 0.15 mg/mg[Cre]  Urinalysis, Routine w reflex microscopic Urine, Clean Catch   Collection Time: 06/10/20 12:58 PM  Result Value Ref Range   Color, Urine YELLOW YELLOW   APPearance HAZY (A) CLEAR   Specific Gravity, Urine 1.023 1.005 - 1.030   pH 5.0 5.0 - 8.0   Glucose, UA NEGATIVE NEGATIVE mg/dL   Hgb urine dipstick NEGATIVE NEGATIVE   Bilirubin Urine NEGATIVE NEGATIVE   Ketones, ur NEGATIVE NEGATIVE mg/dL   Protein, ur 812 (A) NEGATIVE mg/dL   Nitrite NEGATIVE NEGATIVE   Leukocytes,Ua NEGATIVE NEGATIVE   RBC / HPF 0-5 0 - 5 RBC/hpf   WBC, UA 0-5 0 - 5 WBC/hpf   Bacteria, UA RARE (A) NONE SEEN   Squamous Epithelial / LPF 11-20 0 - 5   Mucus PRESENT   Type and screen [facility]   Collection Time: 06/10/20  3:50 PM  Result Value Ref Range   ABO/RH(D) A POS    Antibody Screen NEG    Sample Expiration      06/13/2020,2359 Performed at [facility], [address] [place], [place] [address]   Respiratory Panel by RT PCR (Flu A&B, Covid) - Nasopharyngeal Swab   Collection Time: 06/10/20   3:53 PM   Specimen: Nasopharyngeal Swab  Result Value Ref Range   SARS Coronavirus 2 by RT PCR NEGATIVE NEGATIVE   Influenza A by PCR NEGATIVE NEGATIVE   Influenza B by PCR NEGATIVE NEGATIVE    Imaging Studies:    MFM growth and BPP 10/8  BPP 8/8.  Umbilical artery Doppler showed intermittent absent end-diastolic flow  (mostly absent end-diastolic flow). Est. FW:     741  gm    1 lb 10 oz     < 1  %    Medications:  Scheduled . betamethasone acetate-betamethasone sodium phosphate  12 mg Intramuscular Q24H  . docusate sodium  100 mg Oral Daily  . prenatal multivitamin  1 tablet Oral Q1200  . sodium chloride flush  3 mL Intravenous Q12H   Prior to Admission:  Medications Prior to Admission  Medication Sig Dispense Refill Last Dose  . aspirin 81 MG EC tablet Take 81 mg by mouth daily. Swallow whole.   06/10/2020 at Unknown time  . Prenatal Vit w/Fe-Methylfol-FA (PNV PO) Take by mouth.   06/10/2020 at Unknown time  . diclofenac (VOLTAREN) 75 MG EC tablet Take 1 tablet (75 mg total) by mouth 2 (two) times daily. (Patient not taking: Reported on 07/08/2018) 50 tablet 2   . MACA ROOT PO Take 750 mg by mouth daily. 750 mg (Patient not taking: Reported on 06/10/2020)       ASSESSMENT: G1P0 at [redacted]w[redacted]d  Gest HTN/?PEC Severe IUGR < 1% EFW Obese, BMI 47 FHT category 1, appropriate for gestational age Antenatal corticosteroids in progress S/P Mag Sulfate for neuro prophylaxis discontinued this AM per MFM recommendation to Dr. Connye Burkitt  PLAN: DC Mag Sulfate NST q shift Inpatient management for BP monitoring Repeat PEC labs today for trend  MFM Recommendations -admit Ob Specialty Care if indicated. -Betamethasone course. -Labs as per protocol. -Daily NST. -Twice-weekly BPP and UA Doppler studies from 28 weeks. -Inpatient management if preeclampsia with severe features is confirmed. -Start oral antihypertensives if SBP is ?155 and/or DBP?105 mm Hg. Delivery is indicated (not limited to)  for following reasons:  -Persistent and uncontrolled hypertension not responding to antihypertensives -Abnormal labs including increasing liver enzymes or thrombocytopenia (<100,000).  -Non-reassuring fetal heart trace.  -Oligohydramnios or abnormal antenatal  testings.  -Oliguria (hourly output 20 mL to 30 mL).  -Increasing creatinine (?1.1) Severe symptoms of headache or visual spots or persistent right upper quadrant pain.  -Placental abruption.   Continue routine antenatal care.   POC in consult w/ Dr. Maureen Chatters 06/11/2020,9:11 AM

## 2020-06-11 NOTE — Progress Notes (Signed)
Continuous EFM d/c per order

## 2020-06-11 NOTE — Progress Notes (Addendum)
Reviewed the FHR tracing. Baseline 130bpm, minimal to moderate variability, 10x10 accels, and occasional subtle variables to the 120s lasting 2-3 minutes.   Vital signs stable with mild range BP of 135/91 at 0405. Magnesium sulfate infusing at 2gm/hr.   Dr. Connye Burkitt updated on patient status and plan of care.   [name] 06/11/2020, 6:48 AM

## 2020-06-12 ENCOUNTER — Inpatient Hospital Stay (HOSPITAL_COMMUNITY): Payer: BC Managed Care – PPO

## 2020-06-12 DIAGNOSIS — O365920 Maternal care for other known or suspected poor fetal growth, second trimester, not applicable or unspecified: Secondary | ICD-10-CM | POA: Diagnosis not present

## 2020-06-12 DIAGNOSIS — O1402 Mild to moderate pre-eclampsia, second trimester: Secondary | ICD-10-CM | POA: Diagnosis not present

## 2020-06-12 LAB — CULTURE, BETA STREP (GROUP B ONLY)

## 2020-06-12 NOTE — Consult Note (Signed)
Follow-up Consultation  Maternal-Fetal Medicine   Name: Jennifer Curtis DOB: Sep 03, 1991 MRN: [id number] Referring Provider: Nigel Bridgeman, CNM  Jennifer Curtis, G1 P0 at 27w 5d gestation, was admitted after severe range blood pressures were seen at my office.  Subsequently, patient had mild hypertension.  She does not have symptoms of severe headache or visual disturbances or right upper quadrant pain or vaginal bleeding.  Labs including liver enzymes, creatinine and platelets are within normal range. Patient reports good fetal movements. Since admission she had received a course of betamethasone.  Magnesium sulfate was started at admission and was discontinued after 24 hours.  P/E: Patient is comfortably lying on the ultrasound examination table.  She is not in distress. Blood pressure ranges 138-152/73-99 mmHg.  Pulse ox 98% (room air), pulse 82/min, afebrile NST reactive for this gestational age.  Labs: Hemoglobin 13.9, hematocrit 41.3, WBC 18.4, platelets 297, AST 26, ALT 31, creatinine 0.88, electrolytes within normal range, SARS-CoV-2 negative, influenza panel negative.  On ultrasound, amniotic fluid is normal good fetal activity seen.  Cephalic presentation.  Antenatal testing is reassuring.  BPP 8/8.  Umbilical artery Doppler showed intermittent absent end-diastolic flow (mostly absent end-diastolic flow).  Protein creatinine ratio is increased at 1.04.  Preeclampsia without severe features I explained the most probable diagnosis and reassured of stable ultrasound findings. Patient has not had severe range blood pressures over the last 24 to 48 hours.  She does not have symptoms of severe features of preeclampsia. I discussed outpatient versus inpatient management.  If patient does not have symptoms of severe features and her blood pressure remains within normal range for the next 24 to 48 hours, outpatient management may be considered.  She lives 20 minutes from the hospital and has  blood pressure cuff at home. I discussed blood pressure parameters.  Recommendations -Inpatient management for 24 to 48 hours and close monitoring of her blood pressures. -BPP and UA Doppler on Tuesdays/Fridays at our office. -Antihypertensives to be started when severe range blood pressures are seen. -Continue expectant management till delivery at [redacted] weeks gestation provided her antenatal testing remains reassuring.  Thank you for your consultation.  If you have any questions or concerns please call me at the [facility].  Consultation including face-to-face counseling 30 minutes.

## 2020-06-12 NOTE — Progress Notes (Signed)
Che Below is a 29 y.o. G1P0 at [redacted]w[redacted]d admitted for severe range blood pressures and IUGR.Jennifer Curtis    Length of Stay:  2 Days  Subjective: Resting in bed, comfortable. Denies HA/NV/RUQ pain/visual changes. Spouse present and supportive.   Patient reports good fetal movement.  She reports no uterine contractions, no bleeding and no loss of fluid.  Vitals:   Patient Vitals for the past 24 hrs: Today's Vitals   06/11/20 1932 06/11/20 2307 06/12/20 0423 06/12/20 0747  BP:  (!) 144/80 (!) 147/80 138/81  Pulse:  88 91 82  Resp:  18 18 18   Temp:  98 F (36.7 C) 98.1 F (36.7 C) 98.3 F (36.8 C)  TempSrc:  Oral Oral Oral  SpO2:  96% 100% 99%  Weight:      Height:      PainSc: 0-No pain      Physical Examination: General appearance - alert, well appearing, and in no distress Mental status - alert, oriented to person, place, and time, normal mood, behavior, speech, dress, motor activity, and thought processes Abdomen - soft, nontender, nondistended, no masses or organomegaly, gravid Cervical Exam: deferred Membranes: Intact  Fetal Monitoring:  Baseline: 150 bpm, Variability: Fair (1-6 bpm), Accelerations: occasional 10/10, Decelerations: Variable: mild  Labs:  Results for orders placed or performed during the hospital encounter of 06/10/20 (from the past 24 hour(s))  CBC   Collection Time: 06/11/20 11:09 AM  Result Value Ref Range   WBC 18.4 (H) 4.0 - 10.5 K/uL   RBC 4.56 3.87 - 5.11 MIL/uL   Hemoglobin 13.9 12.0 - 15.0 g/dL   HCT 08/11/20 36 - 46 %   MCV 90.6 80.0 - 100.0 fL   MCH 30.5 26.0 - 34.0 pg   MCHC 33.7 30.0 - 36.0 g/dL   RDW 85.4 62.7 - 03.5 %   Platelets 297 150 - 400 K/uL   nRBC 0.1 0.0 - 0.2 %  Comprehensive metabolic panel   Collection Time: 06/11/20 11:09 AM  Result Value Ref Range   Sodium 134 (L) 135 - 145 mmol/L   Potassium 4.3 3.5 - 5.1 mmol/L   Chloride 103 98 - 111 mmol/L   CO2 21 (L) 22 - 32 mmol/L   Glucose, Bld 139 (H) 70 - 99 mg/dL   BUN 12 6 - 20  mg/dL   Creatinine, Ser 08/11/20 0.44 - 1.00 mg/dL   Calcium 7.7 (L) 8.9 - 10.3 mg/dL   Total Protein 6.3 (L) 6.5 - 8.1 g/dL   Albumin 2.8 (L) 3.5 - 5.0 g/dL   AST 26 15 - 41 U/L   ALT 31 0 - 44 U/L   Alkaline Phosphatase 81 38 - 126 U/L   Total Bilirubin 0.2 (L) 0.3 - 1.2 mg/dL   GFR, Estimated 3.81 >82 mL/min   Anion gap 10 5 - 15  Protein / creatinine ratio, urine   Collection Time: 06/11/20  7:45 PM  Result Value Ref Range   Creatinine, Urine 216.55 mg/dL   Total Protein, Urine 226 mg/dL   Protein Creatinine Ratio 1.04 (H) 0.00 - 0.15 mg/mg[Cre]   Imaging Studies:    MFM growth and BPP 10/8 BPP 8/8.  Umbilical artery Doppler showed intermittent absent end-diastolic flow (mostly absent end-diastolic flow). EFW: 741gms, 1lb 10oz, < 1  %   Medications:  Scheduled . docusate sodium  100 mg Oral Daily  . prenatal multivitamin  1 tablet Oral Q1200  . sodium chloride flush  3 mL Intravenous Q12H   Prior to  Admission:  Medications Prior to Admission  Medication Sig Dispense Refill Last Dose  . aspirin 81 MG EC tablet Take 81 mg by mouth daily. Swallow whole.   06/10/2020 at Unknown time  . Prenatal Vit w/Fe-Methylfol-FA (PNV PO) Take by mouth.   06/10/2020 at Unknown time  . diclofenac (VOLTAREN) 75 MG EC tablet Take 1 tablet (75 mg total) by mouth 2 (two) times daily. (Patient not taking: Reported on 07/08/2018) 50 tablet 2   . MACA ROOT PO Take 750 mg by mouth daily. 750 mg (Patient not taking: Reported on 06/10/2020)      ASSESSMENT: G1P0 at [redacted]w[redacted]d  Gestational HTN Severe IUGR < 1% EFW Obese, BMI 47 FHT with minimal variability, occasional 10x10 accels, no decels BMZ complete 10/8 and 10/9 S/P Mag Sulfate for neuro prophylaxis PEC labs stable   PLAN: NST q shift Will order BPP for today for non-reactive NST Monitor BP and labetalol protocol for severe range BP Routine antenatal care  MFM Recommendations -Betamethasone course. -Labs as per protocol -Daily  NST. -Twice-weekly BPP and UA Doppler studies from 28 weeks. -Inpatient management if preeclampsia with severe features is confirmed. -Start oral antihypertensives if SBP is ?155 and/or DBP?105 mm Hg. Delivery is indicated (not limited to) for following reasons:  -Persistent and uncontrolled hypertension not responding to antihypertensives -Abnormal labs including increasing liver enzymes or thrombocytopenia (<100,000).  -Non-reassuring fetal heart tracing -Oligohydramnios or abnormal antenatal testings -Oliguria (hourly output 20 mL to 30 mL).  -Increasing creatinine (?1.1) Severe symptoms of headache or visual spots or persistent right upper quadrant pain.  -Placental abruption  Dr. Connye Burkitt updated on plan of care.   [name] 06/12/2020,10:39 AM

## 2020-06-13 LAB — COMPREHENSIVE METABOLIC PANEL
ALT: 24 U/L (ref 0–44)
AST: 21 U/L (ref 15–41)
Albumin: 2.4 g/dL — ABNORMAL LOW (ref 3.5–5.0)
Alkaline Phosphatase: 72 U/L (ref 38–126)
Anion gap: 8 (ref 5–15)
BUN: 16 mg/dL (ref 6–20)
CO2: 25 mmol/L (ref 22–32)
Calcium: 8.4 mg/dL — ABNORMAL LOW (ref 8.9–10.3)
Chloride: 106 mmol/L (ref 98–111)
Creatinine, Ser: 0.91 mg/dL (ref 0.44–1.00)
GFR, Estimated: >60 mL/min (ref >60)
Glucose, Bld: 103 mg/dL — ABNORMAL HIGH (ref 70–99)
Potassium: 3.9 mmol/L (ref 3.5–5.1)
Sodium: 139 mmol/L (ref 135–145)
Total Bilirubin: 0.4 mg/dL (ref 0.3–1.2)
Total Protein: 5.8 g/dL — ABNORMAL LOW (ref 6.5–8.1)

## 2020-06-13 LAB — CBC
HCT: 39.5 % (ref 36.0–46.0)
Hemoglobin: 13.2 g/dL (ref 12.0–15.0)
MCH: 30.4 pg (ref 26.0–34.0)
MCHC: 33.4 g/dL (ref 30.0–36.0)
MCV: 91 fL (ref 80.0–100.0)
Platelets: 246 10*3/uL (ref 150–400)
RBC: 4.34 MIL/uL (ref 3.87–5.11)
RDW: 13.3 % (ref 11.5–15.5)
WBC: 17.6 10*3/uL — ABNORMAL HIGH (ref 4.0–10.5)
nRBC: 0.3 % — ABNORMAL HIGH (ref 0.0–0.2)

## 2020-06-13 NOTE — Progress Notes (Signed)
Arlan Organ, CNM notified to review fetal monitoring. RN given order to monitor for the remainder of the night.

## 2020-06-13 NOTE — Progress Notes (Addendum)
   Jennifer Curtis is a 29 y.o. G1P0 at [redacted]w[redacted]d  who is admitted for severe range blood pressures and IUGR. Fetal presentation is cephalic. Length of Stay:  3  Days  Subjective: Resting in bed, comfortable. Denies HA/NV/RUQ pain/visual changes.   Patient reports good fetal movement.  She reports no uterine contractions, no bleeding and no loss of fluid per vagina. Spouse present and supportive.  Vitals:  Patient Vitals for the past 24 hrs:  BP Temp Temp src Pulse Resp SpO2  06/13/20 0832 (!) 155/75 -- -- (!) 58 -- --  06/13/20 0814 (!) 163/84 -- -- (!) 57 -- --  06/13/20 0517 134/78 98.5 F (36.9 C) Oral (!) 59 18 98 %  06/12/20 2343 132/78 98.2 F (36.8 C) Oral 79 18 96 %  06/12/20 1931 (!) 148/88 98.4 F (36.9 C) Oral 90 18 99 %  06/12/20 1515 (!) 158/98 98.2 F (36.8 C) Oral 79 18 99 %  06/12/20 1131 (!) 153/93 97.9 F (36.6 C) Oral 78 18 98 %    Physical Examination: General appearance - alert, well appearing, and in no distress Cervical Exam: Not evaluated Extremities: edema trace pedal with DTRs 2+ bilaterally Membranes:intact  Fetal Monitoring:  Baseline: 150 bpm, Variability: Good {> 6 bpm), Accelerations: Non-reactive but appropriate for gestational age and Decelerations: Absent  Labs:  No results found for this or any previous visit (from the past 24 hour(s)).  Imaging Studies:    10/8 MFM growth Est. FW:     741  gm    1 lb 10 oz     < 1  % Possible intermittent absent and reversal of diastolic flow  10/10 MFM sono  BPP 8/8.  Umbilical artery Doppler showed  intermittent absent end-diastolic flow (mostly absent end-  diastolic flow).  Medications:  Scheduled . docusate sodium  100 mg Oral Daily  . prenatal multivitamin  1 tablet Oral Q1200  . sodium chloride flush  3 mL Intravenous Q12H   I have reviewed the patient's current medications.  ASSESSMENT: Gestational HTN Severe IUGR < 1% EFW Obese, BMI 47 FHTreassuring BMZ complete 10/8 and 10/9 S/P  Mag Sulfate for neuro prophylaxis PEC labs stable GBS negative 10/8   PLAN: NST q shift MFM sono/dopplers tomorrow Monitor BP and labetalol protocol for severe range BP Routine antenatal care, remain inpatient for labile BP to severe range Needs 3rd trimester labs, plan 1GTT one week after BMZ completed.   MFMRecommendations -Betamethasone course. Completed 10/8, 10/9 -Labs as per protocol -Daily NST. -Twice-weekly BPP and UA Doppler studies from 28 weeks. -Inpatient management if preeclampsia with severe features is confirmed. -Start oral antihypertensives if SBP is ?155 and/or DBP?105 mm Hg. Delivery is indicated (not limited to) for following reasons:  -Persistent and uncontrolled hypertension not responding to antihypertensives -Abnormal labs including increasing liver enzymes or thrombocytopenia (<100,000).  -Non-reassuring fetal heart tracing -Oligohydramnios or abnormal antenatal testings -Oliguria (hourly output 20 mL to 30 mL).  -Increasing creatinine (?1.1) Severe symptoms of headache or visual spots or persistent right upper quadrant pain.  -Placental abruption   Will update Dr. Corinna Capra, CNM 06/13/2020,9:12 AM   Pt denies any GHTNsive sxs.  She is ok with inpt mgmt until delivery.  Currently up at bedside eating lunch. FHTs 160, no accels, no decels, min - mod variability.  Scheduled for BPP with dopplers Tuesdays and Fridays with MFM.

## 2020-06-13 NOTE — Progress Notes (Signed)
Notified Arlan Organ, CNM regarding the variables and minimal variability. Told to keep pt on the monitor for another hour. Will continue to monitor.

## 2020-06-13 NOTE — Progress Notes (Signed)
Notified by RN about repetitive decels, EFM continued through the evening. BL 150-160, minimal to moderate variability, small accells occasional, small sags/decels lasting 40-50 sec down 10-20 BPM , 3-4 / hour past 3 hours.  Will continue EFM overnight.   Monitor closely for NRFHT developing.  BP labile to mild range, no severe range since this am.   Neta Mends, MSN, CNM 06/13/2020, 11:22 PM

## 2020-06-13 NOTE — Progress Notes (Signed)
RN went in to pt's room at 1600 to ask her when she wanted to be monitored for the afternoon. Pt stated she wanted to be monitored around 1700. RN went in to attempt monitoring at 1700 and pt was not in room. Will monitor pt when she returns to her room.

## 2020-06-14 ENCOUNTER — Inpatient Hospital Stay (HOSPITAL_BASED_OUTPATIENT_CLINIC_OR_DEPARTMENT_OTHER): Payer: BC Managed Care – PPO

## 2020-06-14 DIAGNOSIS — O1413 Severe pre-eclampsia, third trimester: Secondary | ICD-10-CM

## 2020-06-14 DIAGNOSIS — O365930 Maternal care for other known or suspected poor fetal growth, third trimester, not applicable or unspecified: Secondary | ICD-10-CM | POA: Diagnosis not present

## 2020-06-14 DIAGNOSIS — O133 Gestational [pregnancy-induced] hypertension without significant proteinuria, third trimester: Secondary | ICD-10-CM | POA: Diagnosis not present

## 2020-06-14 DIAGNOSIS — Z3A28 28 weeks gestation of pregnancy: Secondary | ICD-10-CM

## 2020-06-14 DIAGNOSIS — O368930 Maternal care for other specified fetal problems, third trimester, not applicable or unspecified: Secondary | ICD-10-CM

## 2020-06-14 LAB — TYPE AND SCREEN
ABO/RH(D): A POS
Antibody Screen: NEGATIVE

## 2020-06-14 MED ORDER — LABETALOL HCL 5 MG/ML IV SOLN: 80.0000 mg | INTRAVENOUS | Status: DC | PRN

## 2020-06-14 MED ORDER — MAGNESIUM SULFATE 40 GM/1000ML IV SOLN
2.0000 g/h | INTRAVENOUS | Status: DC
  Administered 2020-06-15: 2 g/h via INTRAVENOUS
  Filled 2020-06-14 (×2): qty 1000

## 2020-06-14 MED ORDER — ONDANSETRON HCL 4 MG/2ML IJ SOLN
4.0000 mg | Freq: Four times a day (QID) | INTRAMUSCULAR | Status: DC
  Administered 2020-06-14 – 2020-06-16 (×4): 4 mg via INTRAVENOUS
  Filled 2020-06-14 (×6): qty 2

## 2020-06-14 MED ORDER — LABETALOL HCL 5 MG/ML IV SOLN: 40.0000 mg | INTRAVENOUS | Status: DC | PRN

## 2020-06-14 MED ORDER — HYDRALAZINE HCL 20 MG/ML IJ SOLN: 10.0000 mg | INTRAMUSCULAR | Status: DC | PRN

## 2020-06-14 MED ORDER — LABETALOL HCL 5 MG/ML IV SOLN: 20.0000 mg | INTRAVENOUS | Status: DC | PRN

## 2020-06-14 MED ORDER — LABETALOL HCL 200 MG PO TABS
200.0000 mg | ORAL_TABLET | Freq: Two times a day (BID) | ORAL | Status: DC
  Administered 2020-06-14 (×2): 200 mg via ORAL
  Filled 2020-06-14 (×2): qty 1

## 2020-06-14 MED ORDER — MAGNESIUM SULFATE BOLUS VIA INFUSION
4.0000 g | Freq: Once | INTRAVENOUS | Status: AC
  Administered 2020-06-14: 4 g via INTRAVENOUS
  Filled 2020-06-14: qty 1000

## 2020-06-14 NOTE — Progress Notes (Addendum)
BP values reviewed. Pt treated for severe range BP x4 w/ Labetalol protocol. BP discussed w/ Dr. Mora Appl. POC developed to start oral antihypertensives Labetalol 200 mg PO BID and MgSO4. Will repeat CBC, CMP, and uric acid daily.  S:  Pt denies HA, visual changes, and epigastric pain. Agrees to oral antihypertensives and MgSO4. Advised to reduce stimuli and may have reg diet. Discussed plan w/ pt at bedside and answered questions  O: .vital       133/87  --  99 %             152/93Abnormal  --  99 %             151/98Abnormal  --  100 %             --  --  100 %             165/94Abnormal  --  98 %             175/102Abnormal  --  --             169/96Abnormal  --  --             174/92Abnormal  --  --             173/100Abnormal  --  --             164/93Abnormal  --  --             162/81Abnormal  --  --             161/95Abnormal              Assessment/Plan:   29 y.o. G1P0 [redacted]w[redacted]d  Pre-e w/ severe features Severe IUGR <1% Cat 1    -no variables since 0800 Preterm     -S/P BMZ 10/8 and 10/9    -NICU consult completed    -MFM consult complete U/S today for BPP and dopplers    -vertex/anterior placenta/AFI 11.4/BPP 8/8    -Umbilical artery Doppler showed intermittent reversed  end-diastolic flow. Plan for BPP and UA Doppler on 06/17/20 per MFM  Rhea Pink, MSN, CNM 06/14/2020 1:08 PM

## 2020-06-14 NOTE — Progress Notes (Signed)
Jennifer Curtis is a 29 y.o. G1P0 at [redacted]w[redacted]d  who is admitted for severe range blood pressures and IUGR. Fetal presentation is cephalic. Length of Stay:  4 Days  Subjective: CNM called by RN for decels x2 lasting >1 min w/ spont resolution to baseline w/ moderate variability. POC discussed to have BPP and dopplers performed today and MD will determine plan based on results. CNM to bedside, pt reports feeling well, denies feeling ctx. Reports active FM. Discussed POC for MFM and Dr. Mora Appl to review U/S and doppler findings to determine the next steps.   Objective: Vitals with BMI 06/14/2020 06/14/2020 06/13/2020  Height - - -  Weight - - -  BMI - - -  Systolic 151 137 161  Diastolic 81 75 99  Pulse 66 88 79   Physical exam General: AAO x3, NAD GU: abd soft, non-distended, gravid, VE deferred Ext:  Trace edema, non-pitting, no pain, tenderness, or cords FHR : baseline 150 / variability moderate / accelerations 10x10 / no decelerations Toco: no ctx per TOCO, pt denies feeling ctx, none palpated Membranes: intact     Assessment/Plan:   29 y.o. G1P0 [redacted]w[redacted]d  Pre-e w/ severe features Severe IUGR <1% Obese BMI 47 Cat 1    -variables spont resolved w/o intervention Preterm     -S/P BMZ 10/8 and 10/9    -NICU consult completed    -MFM consult complete    -U/S today for BPP and dopplers  MFMRecommendations -Betamethasone course. Completed 10/8, 10/9 -Labs as per protocol -Daily NST. -Twice-weekly BPP and UA Doppler studies from 28 weeks. -Inpatient management if preeclampsia with severe features is confirmed. -Start oral antihypertensives if SBP is ?155 and/or DBP?105 mm Hg. Delivery is indicated (not limited to) for following reasons:  -Persistent and uncontrolled hypertension not responding to antihypertensives -Abnormal labs including increasing liver enzymes or thrombocytopenia (<100,000). -Non-reassuring fetal heart tracing -Oligohydramnios or abnormal antenatal  testings -Oliguria (hourly output 20 mL to 30 mL).  -Increasing creatinine (?1.1) Severe symptoms of headache or visual spots or persistent right upper quadrant pain.  -Placental abruption     Roma Schanz MSN, CNM 06/14/2020 10:50 AM

## 2020-06-14 NOTE — Progress Notes (Signed)
Provider called about  pts decelarations  Will call ultrasound to obtain  bpp and [place] earlier

## 2020-06-14 NOTE — Progress Notes (Signed)
[place] notified and will call for pt when they return to their unit from OR

## 2020-06-14 NOTE — Lactation Note (Signed)
Lactation Consultation Note  Patient Name: Jennifer Curtis Today's Date: 06/14/2020  Called to room per pt requests, states with questions regarding breastfeeding, would like to know if will be able to BF following delivery, currently [redacted]weeks pregnant. Desires to provide BM only for baby. Has a DEBP obtained from friend, unsure if with all parts. Discussed mom's EBM, DBM and formula as feeding options. Mom will discuss options further with dad. Mom reports noting darkening to areola, increase breast size, leaking of milk during pregnancy, advised all positive signs for ability to provide BM following delivery. Encouraged to obtain working DEBP for use following delivery. Advised no stimulation of nipples during pregnancy, encouraged take prenatal BF course, discussed [facility]  BF course as an option. Advised gestational age and newborn factors at delivery will determine baby's ability to latch. Mom voiced understanding and with no further concerns. Erenest Rasher, RN, IBCLC     [name] 06/14/2020, 6:27 PM

## 2020-06-15 LAB — COMPREHENSIVE METABOLIC PANEL
ALT: 21 U/L (ref 0–44)
AST: 26 U/L (ref 15–41)
Albumin: 2.4 g/dL — ABNORMAL LOW (ref 3.5–5.0)
Alkaline Phosphatase: 76 U/L (ref 38–126)
Anion gap: 8 (ref 5–15)
BUN: 15 mg/dL (ref 6–20)
CO2: 23 mmol/L (ref 22–32)
Calcium: 7.5 mg/dL — ABNORMAL LOW (ref 8.9–10.3)
Chloride: 104 mmol/L (ref 98–111)
Creatinine, Ser: 0.89 mg/dL (ref 0.44–1.00)
GFR, Estimated: >60 mL/min (ref >60)
Glucose, Bld: 100 mg/dL — ABNORMAL HIGH (ref 70–99)
Potassium: 4.3 mmol/L (ref 3.5–5.1)
Sodium: 135 mmol/L (ref 135–145)
Total Bilirubin: 0.3 mg/dL (ref 0.3–1.2)
Total Protein: 5.7 g/dL — ABNORMAL LOW (ref 6.5–8.1)

## 2020-06-15 LAB — CBC WITH DIFFERENTIAL/PLATELET
Abs Immature Granulocytes: 0.67 10*3/uL — ABNORMAL HIGH (ref 0.00–0.07)
Basophils Absolute: 0.1 10*3/uL (ref 0.0–0.1)
Basophils Relative: 0 %
Eosinophils Absolute: 0 10*3/uL (ref 0.0–0.5)
Eosinophils Relative: 0 %
HCT: 39.8 % (ref 36.0–46.0)
Hemoglobin: 13.3 g/dL (ref 12.0–15.0)
Immature Granulocytes: 4 %
Lymphocytes Relative: 19 %
Lymphs Abs: 3.3 10*3/uL (ref 0.7–4.0)
MCH: 30.2 pg (ref 26.0–34.0)
MCHC: 33.4 g/dL (ref 30.0–36.0)
MCV: 90.5 fL (ref 80.0–100.0)
Monocytes Absolute: 1.1 10*3/uL — ABNORMAL HIGH (ref 0.1–1.0)
Monocytes Relative: 7 %
Neutro Abs: 12.1 10*3/uL — ABNORMAL HIGH (ref 1.7–7.7)
Neutrophils Relative %: 70 %
Platelets: 239 10*3/uL (ref 150–400)
RBC: 4.4 MIL/uL (ref 3.87–5.11)
RDW: 13.3 % (ref 11.5–15.5)
WBC: 17.3 10*3/uL — ABNORMAL HIGH (ref 4.0–10.5)
nRBC: 0.5 % — ABNORMAL HIGH (ref 0.0–0.2)

## 2020-06-15 LAB — URIC ACID: Uric Acid, Serum: 6.5 mg/dL (ref 2.5–7.1)

## 2020-06-15 MED ORDER — NIFEDIPINE ER OSMOTIC RELEASE 30 MG PO TB24
30.0000 mg | ORAL_TABLET | Freq: Every day | ORAL | Status: DC
  Administered 2020-06-15 – 2020-06-19 (×5): 30 mg via ORAL
  Filled 2020-06-15 (×5): qty 1

## 2020-06-15 MED ORDER — SALINE SPRAY 0.65 % NA SOLN
1.0000 | NASAL | Status: DC | PRN
  Administered 2020-06-15: 1 via NASAL
  Filled 2020-06-15: qty 44

## 2020-06-15 MED ORDER — LABETALOL HCL 200 MG PO TABS
300.0000 mg | ORAL_TABLET | Freq: Four times a day (QID) | ORAL | Status: DC
  Administered 2020-06-15 – 2020-06-19 (×18): 300 mg via ORAL
  Filled 2020-06-15 (×18): qty 1

## 2020-06-15 NOTE — Consult Note (Signed)
Neonatology Consult Note:  At the request of the patients obstetrician Dr. Charlesetta Garibaldi I met with Jennifer Curtis (and her husband)who is a 29 y.o.G1P0 at [redacted]w[redacted]d [facility]. She is being managed with magnesium sulfate and labetalol.   BMZ completed 10/8 & 10/9.  We discussed morbidity/mortality at this gestional age, delivery room resuscitation, including intubation and surfactant in DR.  Discussed mechanical ventilation and risk for chronic lung disease, risk for IVH with potential for motor / cognitive deficits, ROP, NEC, sepsis, as well as temperature instability and feeding immaturity.  Discussed NG / OG feeds, benefits of MBM in reducing incidence of NEC.   Discussed likely length of stay.  Thank you for allowing [place] participate in her care.  Please call with questions.  BHiginio Roger DO  Neonatologist  The total length of face-to-face or floor / unit time for this encounter was 25 minutes.  Counseling and / or coordination of care was greater than fifty percent of the time.

## 2020-06-15 NOTE — Progress Notes (Addendum)
Jennifer Curtis is a 29 y.o. G1P0 at [redacted]w[redacted]d  who is admitted for [facility] with severe range blood pressures and IUGR @ [redacted]w[redacted]d. Fetal presentation is cephalic. Length of Stay:  4 Days  Subjective: Assumed care of pt, pt in NAD, with supportive husband at bedside. Pt stable, denies HA, RUQ pain, or vision changes. RN and pt reported nose bleed, that stopped with applied pressure, pt endorses she just woke with it bleeding she denies trauma. Still denies HA at that time. Husband report pt BP increases when stressed out. Pt continues on magnesium and denies s/sx of mag tox. Pt stable, reviewed POC today and reported that her case was hour by hour and watching for signs of deterioration. Pt verbalized understanding. Pt aware that Jennifer Curtis will assess her at bedside today.   Objective: Patient Vitals for the past 24 hrs:  BP Temp Temp src Pulse Resp SpO2  06/15/20 0757 (!) 151/91 -- -- 86 -- --  06/15/20 0725 (!) 156/104 98 F (36.7 C) Oral 96 16 --  06/15/20 0608 -- -- -- -- 16 --  06/15/20 0500 -- -- -- -- 18 --  06/15/20 0400 -- -- -- -- 16 --  06/15/20 0348 133/81 98 F (36.7 C) Oral 87 18 --  06/15/20 0242 (!) 159/97 97.7 F (36.5 C) Oral 86 18 99 %  06/15/20 0200 -- -- -- -- 18 --  06/15/20 0120 -- -- -- -- 16 --  06/15/20 0000 -- -- -- -- 16 --  06/14/20 2300 -- -- -- -- 18 --  06/14/20 2200 138/85 98 F (36.7 C) Oral 85 16 97 %  06/14/20 2100 137/83 -- -- 86 18 --  06/14/20 2001 (!) 141/90 -- -- 91 18 --  06/14/20 1905 140/81 97.6 F (36.4 C) Oral 91 17 100 %  06/14/20 1804 (!) 142/88 -- -- 76 18 100 %  06/14/20 1701 (!) 149/95 -- -- 86 -- --  06/14/20 1605 140/80 97.8 F (36.6 C) Oral 86 18 100 %  06/14/20 1501 131/80 -- -- 82 -- --  06/14/20 1500 -- -- -- -- -- 96 %  06/14/20 1400 135/85 -- -- 97 -- 98 %  06/14/20 1350 (!) 141/96 -- -- 91 -- 99 %  06/14/20 1340 133/87 -- -- 96 -- 99 %  06/14/20 1335 (!) 152/93 -- -- 76 18 99 %  06/14/20 1230 (!) 151/98 -- -- 78 -- 100 %   06/14/20 1220 -- -- -- -- -- 100 %  06/14/20 1215 (!) 165/94 -- -- 74 -- 98 %  06/14/20 1200 (!) 175/102 -- -- 79 -- --  06/14/20 1145 (!) 169/96 -- -- 69 -- --  06/14/20 1143 (!) 174/92 -- -- 67 -- --  06/14/20 1137 (!) 173/100 -- -- 65 -- --  06/14/20 1129 (!) 164/93 -- -- 66 -- --  06/14/20 1056 (!) 162/81 -- -- 61 -- --  06/14/20 1041 (!) 161/95 -- -- 60 -- --   Physical exam General: AAO x3, NAD GU: abd soft, non-distended, gravid, VE deferred Ext:  Trace edema, non-pitting, no pain, tenderness, or cords FHR : baseline 135 / variability minimal / accelerations none / no decelerations, Non-Reactive, Cat 2.  Toco: no ctx per TOCO, pt denies feeling ctx, none palpated Membranes: intact  Labs: PCR 10/8 1.98, 10/9 1.04, CBC, CMP, and uric acide unremarkable, last done 10/12   Intake/Output Summary (Last 24 hours) at 06/15/2020 1035 Last data filed at 06/15/2020 0700 Gross per 24  hour  Intake 2953.31 ml  Output 2450 ml  Net 503.31 ml    Assessment/Plan:   29 y.o. G1P0 [redacted]w[redacted]d admitted for Pre-e w/ severe features at [redacted]w[redacted]d, Severe IUGR <1%, and Obese BMI 47  PreE with SF: MFM consult completed. Severe range Bp yesterday started magnesium at 1330 10/12. SR BP noted last night which required IV labetalol. Asymptomatic. Was on 200mg  BID labetalol and Jennifer Curtis requested increase to 300mg  QID today. Pt had nose bleed this morning, could be related to severe range BP or dry air. Gave nasal saline spray for comfort. Pt on magnesium for severe range BP now and no s/sx of mag tox. BP currently 151/91, stable. Plan for daily CBC, CMP and urinc acid. PCR 10/8 1.98, 10/9 1.04, all other labs last drawn 10/12 were unremarkable. Continue in pt management for development of worsening of symptoms. So far I&O adequate, asymptomatic. MFM recommends delivery via PCS if Persistent and uncontrolled hypertension not responding to antihypertensives, Abnormal labs including increasing liver enzymes or  thrombocytopenia (<100,000), Non-reassuring fetal heart tracing, Oligohydramnios or abnormal antenatal testings, Oliguria (hourly output 20 mL to 30 mL), Increasing creatinine (?1.1) Severe symptoms of headache or visual spots or persistent right upper quadrant pain, or Placental abruption.  IUGR: NICU consulted completed. Continuous monitoring. S/P 24 hours magnesium for neuroprotectant. BMZ completed 10/8 & 10/9. Cat 2 with non reactive NST could be due to magnesium. Dopplers yielded intermittent end diastolic flow on 12/8, BPP on 10/12 was 8/8. MFM recommended bi-weekly BPP with dopplers until 34 weeks for PCS.   Jennifer 25/42 updated and will assess at bedside today.   [address] FNP-C, CNM, [facility] 06/15/2020 10:34 AM   Addendum: Instructed by Jennifer Curtis to d/c magnesium, start procardia 30mg  xl daily now, continue labetalol 300mg  QID, if neuro s/sx develop proceed with PCS.  12:46 PM 06/15/20\  Pt seen  And examined. Will try to get to a maximum of two blood pressure meds before delivery.  Will also restart magnesium and delivery if  Pt has sxs Uncontrolled BP on two agents Fetal distress or becomes non reasuuring Following closely with Jennifer 06/17/2020

## 2020-06-16 ENCOUNTER — Inpatient Hospital Stay (HOSPITAL_COMMUNITY): Payer: BC Managed Care – PPO | Admitting: Certified Registered Nurse Anesthetist

## 2020-06-16 ENCOUNTER — Encounter (HOSPITAL_COMMUNITY)
Admission: AD | Disposition: A | Discharge status: Home / Self Care | Payer: Self-pay | Source: Home / Self Care | Attending: Obstetrics & Gynecology

## 2020-06-16 ENCOUNTER — Inpatient Hospital Stay (HOSPITAL_COMMUNITY): Payer: BC Managed Care – PPO

## 2020-06-16 DIAGNOSIS — O365930 Maternal care for other known or suspected poor fetal growth, third trimester, not applicable or unspecified: Secondary | ICD-10-CM | POA: Diagnosis not present

## 2020-06-16 DIAGNOSIS — O133 Gestational [pregnancy-induced] hypertension without significant proteinuria, third trimester: Secondary | ICD-10-CM

## 2020-06-16 DIAGNOSIS — O1493 Unspecified pre-eclampsia, third trimester: Secondary | ICD-10-CM

## 2020-06-16 DIAGNOSIS — O358XX0 Maternal care for other (suspected) fetal abnormality and damage, not applicable or unspecified: Secondary | ICD-10-CM

## 2020-06-16 DIAGNOSIS — Z3A28 28 weeks gestation of pregnancy: Secondary | ICD-10-CM

## 2020-06-16 LAB — URIC ACID: Uric Acid, Serum: 6.1 mg/dL (ref 2.5–7.1)

## 2020-06-16 LAB — CBC WITH DIFFERENTIAL/PLATELET
Abs Immature Granulocytes: 0.27 10*3/uL — ABNORMAL HIGH (ref 0.00–0.07)
Basophils Absolute: 0.1 10*3/uL (ref 0.0–0.1)
Basophils Relative: 0 %
Eosinophils Absolute: 0.1 10*3/uL (ref 0.0–0.5)
Eosinophils Relative: 1 %
HCT: 36.9 % (ref 36.0–46.0)
Hemoglobin: 12.5 g/dL (ref 12.0–15.0)
Immature Granulocytes: 2 %
Lymphocytes Relative: 22 %
Lymphs Abs: 3 10*3/uL (ref 0.7–4.0)
MCH: 30.9 pg (ref 26.0–34.0)
MCHC: 33.9 g/dL (ref 30.0–36.0)
MCV: 91.1 fL (ref 80.0–100.0)
Monocytes Absolute: 0.9 10*3/uL (ref 0.1–1.0)
Monocytes Relative: 6 %
Neutro Abs: 9.6 10*3/uL — ABNORMAL HIGH (ref 1.7–7.7)
Neutrophils Relative %: 69 %
Platelets: 219 10*3/uL (ref 150–400)
RBC: 4.05 MIL/uL (ref 3.87–5.11)
RDW: 13.5 % (ref 11.5–15.5)
WBC: 14 10*3/uL — ABNORMAL HIGH (ref 4.0–10.5)
nRBC: 0.1 % (ref 0.0–0.2)

## 2020-06-16 LAB — COMPREHENSIVE METABOLIC PANEL
ALT: 17 U/L (ref 0–44)
AST: 16 U/L (ref 15–41)
Albumin: 2.2 g/dL — ABNORMAL LOW (ref 3.5–5.0)
Alkaline Phosphatase: 67 U/L (ref 38–126)
Anion gap: 7 (ref 5–15)
BUN: 14 mg/dL (ref 6–20)
CO2: 22 mmol/L (ref 22–32)
Calcium: 7.9 mg/dL — ABNORMAL LOW (ref 8.9–10.3)
Chloride: 105 mmol/L (ref 98–111)
Creatinine, Ser: 0.82 mg/dL (ref 0.44–1.00)
GFR, Estimated: >60 mL/min (ref >60)
Glucose, Bld: 96 mg/dL (ref 70–99)
Potassium: 4.5 mmol/L (ref 3.5–5.1)
Sodium: 134 mmol/L — ABNORMAL LOW (ref 135–145)
Total Bilirubin: 0.2 mg/dL — ABNORMAL LOW (ref 0.3–1.2)
Total Protein: 5.5 g/dL — ABNORMAL LOW (ref 6.5–8.1)

## 2020-06-16 LAB — TYPE AND SCREEN
ABO/RH(D): A POS
Antibody Screen: NEGATIVE

## 2020-06-16 LAB — CBC
HCT: 41.3 % (ref 36.0–46.0)
Hemoglobin: 13.7 g/dL (ref 12.0–15.0)
MCH: 30 pg (ref 26.0–34.0)
MCHC: 33.2 g/dL (ref 30.0–36.0)
MCV: 90.4 fL (ref 80.0–100.0)
Platelets: 225 10*3/uL (ref 150–400)
RBC: 4.57 MIL/uL (ref 3.87–5.11)
RDW: 13.5 % (ref 11.5–15.5)
WBC: 12.4 10*3/uL — ABNORMAL HIGH (ref 4.0–10.5)
nRBC: 0 % (ref 0.0–0.2)

## 2020-06-16 SURGERY — Surgical Case
Anesthesia: Spinal

## 2020-06-16 MED ORDER — MORPHINE SULFATE (PF) 0.5 MG/ML IJ SOLN
INTRAMUSCULAR | Status: AC
  Filled 2020-06-16: qty 10

## 2020-06-16 MED ORDER — DEXAMETHASONE SODIUM PHOSPHATE 10 MG/ML IJ SOLN
INTRAMUSCULAR | Status: AC
  Filled 2020-06-16: qty 1

## 2020-06-16 MED ORDER — FENTANYL CITRATE (PF) 100 MCG/2ML IJ SOLN
INTRAMUSCULAR | Status: AC
  Filled 2020-06-16: qty 2

## 2020-06-16 MED ORDER — SOD CITRATE-CITRIC ACID 500-334 MG/5ML PO SOLN
ORAL | Status: AC
  Filled 2020-06-16: qty 30

## 2020-06-16 MED ORDER — MAGNESIUM SULFATE 40 GM/1000ML IV SOLN
INTRAVENOUS | Status: AC
  Administered 2020-06-16: 4 g via INTRAVENOUS
  Filled 2020-06-16: qty 1000

## 2020-06-16 MED ORDER — BUTALBITAL-APAP-CAFFEINE 50-325-40 MG PO TABS
1.0000 | ORAL_TABLET | Freq: Four times a day (QID) | ORAL | Status: DC | PRN
  Administered 2020-06-16: 2 via ORAL
  Filled 2020-06-16: qty 2

## 2020-06-16 MED ORDER — FENTANYL CITRATE (PF) 100 MCG/2ML IJ SOLN
INTRAMUSCULAR | Status: DC | PRN
Start: 2020-06-16 — End: 2020-06-17
  Administered 2020-06-16: 15 ug via INTRATHECAL

## 2020-06-16 MED ORDER — LACTATED RINGERS IV SOLN: INTRAVENOUS | Status: DC | PRN

## 2020-06-16 MED ORDER — ONDANSETRON HCL 4 MG/2ML IJ SOLN
INTRAMUSCULAR | Status: AC
  Filled 2020-06-16: qty 2

## 2020-06-16 MED ORDER — OXYTOCIN-SODIUM CHLORIDE 30-0.9 UT/500ML-% IV SOLN
INTRAVENOUS | Status: AC
  Filled 2020-06-16: qty 500

## 2020-06-16 MED ORDER — PHENYLEPHRINE HCL-NACL 20-0.9 MG/250ML-% IV SOLN
INTRAVENOUS | Status: AC
  Filled 2020-06-16: qty 250

## 2020-06-16 MED ORDER — LACTATED RINGERS IV BOLUS: 500.0000 mL | Freq: Once | INTRAVENOUS | Status: DC

## 2020-06-16 MED ORDER — ONDANSETRON HCL 4 MG/2ML IJ SOLN
INTRAMUSCULAR | Status: DC | PRN
  Administered 2020-06-16: 4 mg via INTRAVENOUS

## 2020-06-16 MED ORDER — MAGNESIUM SULFATE 40 GM/1000ML IV SOLN
2.0000 g/h | INTRAVENOUS | Status: DC
  Administered 2020-06-16: 2 g/h via INTRAVENOUS
  Filled 2020-06-16: qty 1000

## 2020-06-16 MED ORDER — SOD CITRATE-CITRIC ACID 500-334 MG/5ML PO SOLN
30.0000 mL | ORAL | Status: AC
  Administered 2020-06-16: 30 mL via ORAL

## 2020-06-16 MED ORDER — LACTATED RINGERS IV SOLN: INTRAVENOUS | Status: DC

## 2020-06-16 MED ORDER — LACTATED RINGERS IV BOLUS
500.0000 mL | Freq: Once | INTRAVENOUS | Status: AC
  Administered 2020-06-16: 500 mL via INTRAVENOUS

## 2020-06-16 MED ORDER — MORPHINE SULFATE (PF) 0.5 MG/ML IJ SOLN
INTRAMUSCULAR | Status: DC | PRN
  Administered 2020-06-16: 150 ug via INTRATHECAL

## 2020-06-16 MED ORDER — MAGNESIUM SULFATE BOLUS VIA INFUSION
4.0000 g | Freq: Once | INTRAVENOUS | Status: AC
  Filled 2020-06-16: qty 1000

## 2020-06-16 MED ORDER — PROMETHAZINE HCL 25 MG/ML IJ SOLN
25.0000 mg | Freq: Once | INTRAMUSCULAR | Status: AC
  Administered 2020-06-16: 25 mg via INTRAVENOUS
  Filled 2020-06-16: qty 1

## 2020-06-16 MED ORDER — BUPIVACAINE IN DEXTROSE 0.75-8.25 % IT SOLN
INTRATHECAL | Status: DC | PRN
  Administered 2020-06-16: 12 mg via INTRATHECAL

## 2020-06-16 MED ORDER — CEFAZOLIN SODIUM-DEXTROSE 2-4 GM/100ML-% IV SOLN
2.0000 g | INTRAVENOUS | Status: AC
  Administered 2020-06-16: 2 g via INTRAVENOUS
  Filled 2020-06-16: qty 100

## 2020-06-16 MED ORDER — PHENYLEPHRINE HCL-NACL 20-0.9 MG/250ML-% IV SOLN
INTRAVENOUS | Status: DC | PRN
  Administered 2020-06-16: 30 ug/min via INTRAVENOUS

## 2020-06-16 MED ORDER — ACETAMINOPHEN 10 MG/ML IV SOLN
1000.0000 mg | Freq: Once | INTRAVENOUS | Status: AC
  Administered 2020-06-16: 1000 mg via INTRAVENOUS
  Filled 2020-06-16: qty 100

## 2020-06-16 SURGICAL SUPPLY — 44 items
BENZOIN TINCTURE PRP APPL 2/3 (GAUZE/BANDAGES/DRESSINGS) ×3 IMPLANT
CHLORAPREP W/TINT 26ML (MISCELLANEOUS) ×3 IMPLANT
CLAMP CORD UMBIL (MISCELLANEOUS) IMPLANT
CLOSURE STERI STRIP 1/2 X4 (GAUZE/BANDAGES/DRESSINGS) ×3 IMPLANT
CLOTH BEACON ORANGE TIMEOUT ST (SAFETY) ×3 IMPLANT
DERMABOND ADVANCED (GAUZE/BANDAGES/DRESSINGS)
DERMABOND ADVANCED .7 DNX12 (GAUZE/BANDAGES/DRESSINGS) IMPLANT
DRAPE C SECTION CLR SCREEN (DRAPES) ×3 IMPLANT
DRSG OPSITE POSTOP 4X10 (GAUZE/BANDAGES/DRESSINGS) ×3 IMPLANT
ELECT REM PT RETURN 9FT ADLT (ELECTROSURGICAL) ×3
ELECTRODE REM PT RTRN 9FT ADLT (ELECTROSURGICAL) ×1 IMPLANT
EXTENDER TRAXI PANNICULUS (MISCELLANEOUS) ×1 IMPLANT
EXTRACTOR VACUUM M CUP 4 TUBE (SUCTIONS) IMPLANT
EXTRACTOR VACUUM M CUP 4' TUBE (SUCTIONS)
GLOVE BIOGEL PI IND STRL 7.0 (GLOVE) ×2 IMPLANT
GLOVE BIOGEL PI INDICATOR 7.0 (GLOVE) ×4
GLOVE SURG SS PI 6.5 STRL IVOR (GLOVE) ×3 IMPLANT
GOWN STRL REUS W/TWL LRG LVL3 (GOWN DISPOSABLE) ×6 IMPLANT
KIT ABG SYR 3ML LUER SLIP (SYRINGE) IMPLANT
NEEDLE HYPO 25X5/8 SAFETYGLIDE (NEEDLE) IMPLANT
NS IRRIG 1000ML POUR BTL (IV SOLUTION) ×3 IMPLANT
PACK C SECTION WH (CUSTOM PROCEDURE TRAY) ×3 IMPLANT
PAD ABD 7.5X8 STRL (GAUZE/BANDAGES/DRESSINGS) ×3 IMPLANT
PAD OB MATERNITY 4.3X12.25 (PERSONAL CARE ITEMS) ×3 IMPLANT
PENCIL SMOKE EVAC W/HOLSTER (ELECTROSURGICAL) ×3 IMPLANT
RETRACTOR TRAXI PANNICULUS (MISCELLANEOUS) ×1 IMPLANT
RTRCTR C-SECT PINK 25CM LRG (MISCELLANEOUS) ×3 IMPLANT
SPONGE GAUZE 4X4 12PLY STER LF (GAUZE/BANDAGES/DRESSINGS) ×6 IMPLANT
SUT CHROMIC 1 CTX 36 (SUTURE) IMPLANT
SUT CHROMIC 2 0 CT 1 (SUTURE) ×3 IMPLANT
SUT MON AB 4-0 PS1 27 (SUTURE) ×3 IMPLANT
SUT PLAIN 1 NONE 54 (SUTURE) IMPLANT
SUT PLAIN 2 0 (SUTURE)
SUT PLAIN 2 0 XLH (SUTURE) ×3 IMPLANT
SUT PLAIN ABS 2-0 CT1 27XMFL (SUTURE) IMPLANT
SUT VIC AB 0 CTX 36 (SUTURE) ×2
SUT VIC AB 0 CTX36XBRD ANBCTRL (SUTURE) ×1 IMPLANT
SUT VIC AB 1 CTX 36 (SUTURE) ×4
SUT VIC AB 1 CTX36XBRD ANBCTRL (SUTURE) ×2 IMPLANT
TOWEL OR 17X24 6PK STRL BLUE (TOWEL DISPOSABLE) ×3 IMPLANT
TRAXI PANNICULUS EXTENDER (MISCELLANEOUS) ×2
TRAXI PANNICULUS RETRACTOR (MISCELLANEOUS) ×2
TRAY FOLEY W/BAG SLVR 14FR LF (SET/KITS/TRAYS/PACK) ×3 IMPLANT
WATER STERILE IRR 1000ML POUR (IV SOLUTION) ×3 IMPLANT

## 2020-06-16 NOTE — Progress Notes (Addendum)
Jennifer Schaefferis a 29 y.o.G1P0 at 48w2dwho is admitted for severe range blood pressures and IUGR. Fetal presentation iscephalic. Length of Stay:6Days  Subjective: Reports a HA since 0600 that did not resolve w/ Tylenol. Pt states HA has improved slightly since breakfast, but needs something to manage HA. Denies visual changes and epigastric pain active FM. Pt agrees to adding Fioricet to management plan.   Objective:  Vitals:   06/16/20 0733 06/16/20 0754  BP: (!) 147/89 (!) 146/95  Pulse: 80 89  Resp:  18  Temp: 98 F (36.7 C) 98.1 F (36.7 C)  SpO2:  97%     Physical exam General: AAO x3, NAD GU: abd soft, non-distended, gravid, VE deferred Ext:  Trace edema, non-pitting, no pain, tenderness, or cords FHR : baseline 145 / variability minimal / accelerations absent / variable decelerations Toco: no ctx per TOCO, pt denies feeling ctx, none palpated Membranes: intact   Assessment/Plan:   29 y.o. G1P0 [redacted]w[redacted]d  Pre-e w/ severe features    -Continue Labetalol 300 mg PO QID and Procardia 30mg  w/ hold parameters    -S/P MgSO4 for seizure precautions, stopped 10/13   Obese BMI 47  Cat 2    -continuous EFM    -variables spont resolved w/o intervention  Severe IUGR <1%    -S/P BMZ 10/8 and 10/9    -S/P MgSO4 for neuroprophylaxis    -NICU consult completed    -MFM consult complete    -U/S on 10/12 vertex/AFI 11.4/ UA doppler w/ Intermittent reversed end-diastolic flow. Next BPP and UA doppler 10/15  MFMRecommendations -Betamethasone course completed 10/8, 10/9 -Twice-weekly BPP and UA Doppler studies from 28 weeks. -Inpatient management if preeclampsia with severe features is confirmed. -Start oral antihypertensives if SBP is ?155 and/or DBP?105 mm Hg. Delivery is indicated (not limited to) for following reasons:  -Persistent and uncontrolled hypertension not responding to antihypertensives -Abnormal labs including increasing liver enzymes or  thrombocytopenia (<100,000). -Non-reassuring fetal heart tracing -Oligohydramnios or abnormal antenatal testings -Oliguria (hourly output 20 mL to 30 mL).  -Increasing creatinine (?1.1) Severe symptoms of headache or visual spots or persistent right upper quadrant pain.  -Placental abruption  12/9, MSN, CNM 06/16/2020 12:25 PM

## 2020-06-16 NOTE — Progress Notes (Signed)
Fetal surveillance reviewed by Dr. [name] and Rhea Pink, CNM. MD will consult MFM for management update.    Rhea Pink, MSN, CNM 06/16/2020 5:01 PM

## 2020-06-16 NOTE — Progress Notes (Addendum)
Called for HA refractory to Tylenol and Fioricet. CNM to bedside. Pt reports migraine hx that usually resolved w/ "Aleve, dark room, and sleep". Consulted w. Dr. [name] for management options.   S: C/O HA, denies visual changes, and epigastric pain. Declines PO meds D/T current nausea. Pt agrees to IV Tylenol and phenergan. Pt requesting a short w/c ride outside for stress relief.   O: Blood pressure (!) 153/91, pulse 87, temperature 98 F (36.7 C), temperature source Oral, resp. rate 18    [id number] minimal/accels absent/ variabledecel Toco:no ctx per TOCO, pt denies feeling ctx, none palpated   A/P:   Headache w/ hx of migraines   -Tylenol 1G IV   -Phenergan 25mg  IVP   -15 min w/c ride Fetal well-being    -Cat 2    -variables <60 sec w/ spont return to baseline w/o interventions   Plan developed in consult w/ Dr. , MSN, CNM 06/16/2020 2:20 PM

## 2020-06-16 NOTE — Anesthesia Procedure Notes (Signed)
Spinal  Patient location during procedure: OB Start time: 06/16/2020 10:58 PM End time: 06/16/2020 11:07 PM Staffing Performed: anesthesiologist  Anesthesiologist: Trevor Iha, MD Preanesthetic Checklist Completed: patient identified, IV checked, risks and benefits discussed, surgical consent, monitors and equipment checked, pre-op evaluation and timeout performed Spinal Block Patient position: sitting Prep: DuraPrep and site prepped and draped Patient monitoring: heart rate, cardiac monitor, continuous pulse ox and blood pressure Approach: midline Location: L3-4 Injection technique: single-shot Needle Needle type: Pencan  Needle gauge: 24 G Needle length: 10 cm Needle insertion depth: 7 cm Assessment Sensory level: T4 Additional Notes 1 Attempt (s). Pt tolerated procedure well.

## 2020-06-16 NOTE — Op Note (Signed)
Patient: Jennifer Curtis, Jennifer Curtis DOB: [birth year] MRN: [id number]  DATE OF SURGERY: 06/16/2020  PREOP DIAGNOSIS:  1. 28 week 2 day EGA intrauterine pregnancy. 2. Preeclampsia with severe features based on elevated blood pressures in severe range.  3. Severe fetal growth restriction with an EFW < 1st%.  4. Abnormal fetal testing with BPP 2/8 and recurrent category 2 fetal heart tracing.  5. BMI of 47    POSTOP DIAGNOSIS: Same as above.  PROCEDURE: Primary low uterine segment transverse cesarean section via Pfannenstiel incision.     SURGEON: Dr.  Hoover Browns  ASSISTANT: None  SCRUB TECHNICIAN: Sarah  ANESTHESIA: Spinal  COMPLICATIONS: None  FINDINGS: Viable female infant in cephalic presentation, DOA, weight 1 lb 12.2 oz, Apgar scores of 5 and 8. Normal uterus and fallopian tubes and ovaries bilaterally.    EBL:  755 cc  IV FLUID:  1300 cc LR   URINE OUTPUT: 300 cc clear urine  INDICATIONS:  29 y/o G1P0 at 28 weeks 2 days who was admitted for inpatient management of preeclampsia with severe features.  Fetal monitoring showed fetus to have minimal variability and fetal variable decelerations.  A BPP done was 2/8 (2 for fluid only) and umbilical artery dopplers showed reversed end diastolic flow. Due to concern for fetal status, concern for ability of fetus to tolerate the induction of labor process, patient being remote from delivery and nulliparous, a cesarean delivery was recommended and agreed upon.  Informed consent was obtained from the patient to undergo the procedure after explaining risks benefits and alternatives of the procedure.    PROCEDURE:   She was taken to the operating room where her spinal anesthesia was found to be adequate. She was prepped and draped in the usual sterile fashion and a Foley catheter was placed. She received 2 g of IV Ancef preoperatively. A Pfannenstiel incision was made with the scalpel and the incision extended through the subcutaneous layer and also  the fascia with the bovie. Small perforators in the subcutaneous layer were contained with the Bovie. The fascia was nicked in the midline and then was further separated from the rectus muscles bilaterally using Mayo scissors. Kochers were placed inferiorly and then superiorly to allow further separation of fascia from the rectus muscles.  The peritoneal cavity was entered bluntly with the fingers. The Alexis retractor was placed in. The bladder flap was created using Metzenbaum scissors.  The lower uterine segment was noted to be well developed and was incised transversely with a scalpel and the incision extended bluntly bilaterally with fingers and bandage scisors.  Membranes were ruptured and the head then the rest of the body was then delivered with abdominal pressure.  She delivered a viable female infant, apgar scores 5 and 8.  The cord was clamped and cut immediately after prior consultation with NICU team and baby handed over to the NICU team.  The edges of the uterus was grasped with ring forceps.  Cord pH and cord blood were collected.   The uterus was not exteriorized. The placenta was delivered with gentle traction on the umbilical cord and noted to be small. The uterus was cleared of clots and debris with a lap.  The uterine incision was closed with #1 Vicryl in a running locked stitch. An imbricating layer of the same stitch was placed over the initial closure.  Irrigation was applied and suctioned out. Excellent hemostasis was noted over the incision.  The muscles and peritoneum were then reapproximated using chromic suture.  Fascia was  closed using 0 Vicryl in a running stitch. The subcutaneous layer was irrigated and suctioned out. Small perforators were contained with the bovie.  The subcutaneous layer was closed using 1-0 plain in interrupted stitches. The skin was closed using 4-0 Monocryl. Benzocaine and steri strips were applied.  Honeycomb and pressure dressings were applied. The patient was  then cleaned and she was taken to the PACU in stable conditions.  The baby had already been transferred to the NICU in stable condition.  SPECIMEN: Placenta to pathology, arterial and venous umbilical cord pH and umbilical cord blood to lab.   DISPOSITION: PATIENT TO TO PACU, STABLE.   Dr. [name].   06/17/2020. 0136am.

## 2020-06-16 NOTE — Anesthesia Preprocedure Evaluation (Signed)
Anesthesia Evaluation  Patient identified by MRN, date of birth, ID band Patient awake    Reviewed: Allergy & Precautions, NPO status , Patient's Chart, lab work & pertinent test results  Airway Mallampati: III  TM Distance: >3 FB Neck ROM: Full    Dental no notable dental hx. (+) Teeth Intact, Dental Advisory Given   Pulmonary neg pulmonary ROS,    Pulmonary exam normal breath sounds clear to auscultation       Cardiovascular hypertension, Normal cardiovascular exam Rhythm:Regular Rate:Normal  Pre E  Severe On Mg++   Neuro/Psych negative neurological ROS  negative psych ROS   GI/Hepatic negative GI ROS, Neg liver ROS,   Endo/Other  negative endocrine ROS  Renal/GU negative Renal ROS     Musculoskeletal   Abdominal (+) + obese,   Peds  Hematology negative hematology ROS (+) Hgb 13.7 Plt 225  T&S avail   Anesthesia Other Findings   Reproductive/Obstetrics (+) Pregnancy                             Anesthesia Physical Anesthesia Plan  ASA: III and emergent  Anesthesia Plan: Spinal   Post-op Pain Management:    Induction:   PONV Risk Score and Plan: 3 and Treatment may vary due to age or medical condition  Airway Management Planned: Nasal Cannula and Natural Airway  Additional Equipment: None  Intra-op Plan:   Post-operative Plan:   Informed Consent: I have reviewed the patients History and Physical, chart, labs and discussed the procedure including the risks, benefits and alternatives for the proposed anesthesia with the patient or authorized representative who has indicated his/her understanding and acceptance.       Plan Discussed with: CRNA and Anesthesiologist  Anesthesia Plan Comments: (28.2 Wk G1P0 w severe Pre E on Mg++ and IUGR for C/S under spinal)        Anesthesia Quick Evaluation

## 2020-06-16 NOTE — Progress Notes (Signed)
MD [name] called to post case for 10 pm. This RN notified Houser MD. Richardson Landry requested CBC and inquired about NPO status. Per [facility] RN, patient ate last at 1 pm.   With Richardson Landry and [name] approval, RN posted case for 10 pm.   Loetta Rough, RN 06/16/2020 8:07 PM

## 2020-06-16 NOTE — Progress Notes (Addendum)
Jennifer Curtis is a 29 y.o. G1P0 at [redacted]w[redacted]d admitted for preeclampsia with severe features.   Patient seen at bedside at about 2030.  I discussed with patient ultrasound findings of BPP of 2/8 and fetal  heart tracings that has been abnormal.  We discussed recommendations by maternal fetal medicine - Dr. Ma Rings to proceed with delivery for concern of fetal staus.  We discussed indications for cesarean delivery, risks, benefits and alternatives of the cesarean section.  [name]  We discussed possible need to proceed with a classical uterine incision in which case it would be recommended to have a repeat cesarean delivery for any future pregnancies.  We discussed that if able to proceed with a lower uterine segment transverse incision that she may be able to do a trial of labor after cesarean section.  Will give magnesium sulfate for fetal neuro protection for at least two hours before delivery and plan to continue with this for at least 24 hours postpartum.     Subjective: Patient without any complaints.   Objective: BP (!) 151/96 (BP Location: Right Arm)   Pulse 88   Temp 98.6 F (37 C) (Oral)   Resp 18   Ht 5' (1.524 m)   Wt 110.2 kg   LMP 12/01/2019   SpO2 96%   BMI 47.46 kg/m  I/O last 3 completed shifts: In: 953.1 [I.V.:853; IV Piggyback:100.1] Out: 1800 [Urine:1800] Total I/O In: 440.6 [IV Piggyback:440.6] Out: -    FHT:  FHR: 140 bpm, variability: minimal ,  accelerations:  One present at 2047, meets 10 x 10 criteria,  decelerations:  Present: One spontaneous deceleration from 140 to 130 for about 2 minutes at 2055.    UC:   none SVE:    Deferred.   Labs: Lab Results  Component Value Date   WBC 12.4 (H) 06/16/2020   HGB 13.7 06/16/2020   HCT 41.3 06/16/2020   MCV 90.4 06/16/2020   PLT 225 06/16/2020   Assessment / Plan:   29 y.o. G1P0 at [redacted]w[redacted]d admitted for preeclampsia with severe features,  with  abnormal fetal testing and remote from delivery.  She is betamethasone complete.  Will proceed with cesarean delivery after at least two hours of magnesim sulfate for fetal neuro protection if with stable maternal and fetal status. NICU and OR team notified on the patient. Dr. [name]. 06/16/2020.   [name] 06/16/2020, 9:53 PM   Addendum: OR unable to proceed with csection at 10.30 pm due to another Cesarean section that was called in about the same time as when I called in this cesarean section.  OR charge (spoke to [name]) states they are now finishing up this case they should be proceeding with this delivery soon within 10 minutes.  OR team updated of fetal status and need to proceed with cesarean delivery soon. [name], [name] 06/16/2020, 10:45 PM.

## 2020-06-16 NOTE — Consult Note (Signed)
MFM Note  This patient has been admitted due to severe IUGR and preeclampsia.  She is currently at 28 weeks and 2 days.  The patient complained of a severe headache this afternoon which ultimately resolved.  Her fetal heart rate tracing earlier this evening was nonreactive with minimal variability and variable decelerations.    A biophysical profile performed this evening was 2 out of 8.  The patient received a score of 2 for adequate amniotic fluid.  There was absent fetal tone, absent fetal breathing movements, and absent fetal body movements.  Doppler studies of the umbilical arteries showed persistent reversed end-diastolic flow.  Due to nonreassuring fetal status, delivery is recommended tonight as a fetal demise is likely to occur if she is not delivered.  The patient has already received a complete course of antenatal corticosteroids.  She should receive at least 2 hours of magnesium sulfate for fetal neuroprotection prior to delivery.  I discussed the plan with Dr. [name].

## 2020-06-16 NOTE — Plan of Care (Signed)
  Problem: Clinical Measurements: Goal: Ability to maintain clinical measurements within normal limits will improve Outcome: Progressing  Blood pressure measurements remain below severe range.

## 2020-06-17 ENCOUNTER — Encounter (HOSPITAL_COMMUNITY): Payer: Self-pay | Admitting: Obstetrics & Gynecology

## 2020-06-17 LAB — CBC
HCT: 37.1 % (ref 36.0–46.0)
Hemoglobin: 12.6 g/dL (ref 12.0–15.0)
MCH: 31.3 pg (ref 26.0–34.0)
MCHC: 34 g/dL (ref 30.0–36.0)
MCV: 92.1 fL (ref 80.0–100.0)
Platelets: 237 10*3/uL (ref 150–400)
RBC: 4.03 MIL/uL (ref 3.87–5.11)
RDW: 13.5 % (ref 11.5–15.5)
WBC: 19.9 10*3/uL — ABNORMAL HIGH (ref 4.0–10.5)
nRBC: 0.1 % (ref 0.0–0.2)

## 2020-06-17 LAB — GLUCOSE, CAPILLARY: Glucose-Capillary: 37 mg/dL — CL (ref 70–99)

## 2020-06-17 MED ORDER — OXYCODONE HCL 5 MG/5ML PO SOLN: 5.0000 mg | Freq: Once | ORAL | Status: DC | PRN

## 2020-06-17 MED ORDER — ONDANSETRON HCL 4 MG/2ML IJ SOLN: 4.0000 mg | Freq: Once | INTRAMUSCULAR | Status: DC | PRN

## 2020-06-17 MED ORDER — MENTHOL 3 MG MT LOZG: 1.0000 | LOZENGE | OROMUCOSAL | Status: DC | PRN

## 2020-06-17 MED ORDER — FONDAPARINUX SODIUM 2.5 MG/0.5ML ~~LOC~~ SOLN
2.5000 mg | Freq: Every day | SUBCUTANEOUS | Status: DC
  Administered 2020-06-18 – 2020-06-19 (×2): 2.5 mg via SUBCUTANEOUS
  Filled 2020-06-17 (×2): qty 0.5

## 2020-06-17 MED ORDER — OXYTOCIN-SODIUM CHLORIDE 30-0.9 UT/500ML-% IV SOLN: 2.5000 [IU]/h | INTRAVENOUS | Status: AC

## 2020-06-17 MED ORDER — WITCH HAZEL-GLYCERIN EX PADS: 1.0000 "application " | MEDICATED_PAD | CUTANEOUS | Status: DC | PRN

## 2020-06-17 MED ORDER — OXYCODONE HCL 5 MG PO TABS
5.0000 mg | ORAL_TABLET | ORAL | Status: DC | PRN
  Administered 2020-06-18: 5 mg via ORAL
  Filled 2020-06-17: qty 1

## 2020-06-17 MED ORDER — NALBUPHINE HCL 10 MG/ML IJ SOLN: 5.0000 mg | INTRAMUSCULAR | Status: DC | PRN

## 2020-06-17 MED ORDER — NALBUPHINE HCL 10 MG/ML IJ SOLN: 5.0000 mg | Freq: Once | INTRAMUSCULAR | Status: DC | PRN

## 2020-06-17 MED ORDER — LACTATED RINGERS IV SOLN: INTRAVENOUS | Status: DC

## 2020-06-17 MED ORDER — COCONUT OIL OIL: 1.0000 "application " | TOPICAL_OIL | Status: DC | PRN

## 2020-06-17 MED ORDER — OXYTOCIN-SODIUM CHLORIDE 30-0.9 UT/500ML-% IV SOLN
INTRAVENOUS | Status: DC | PRN
  Administered 2020-06-17: 300 mL via INTRAVENOUS

## 2020-06-17 MED ORDER — SCOPOLAMINE 1 MG/3DAYS TD PT72
MEDICATED_PATCH | TRANSDERMAL | Status: AC
  Filled 2020-06-17: qty 1

## 2020-06-17 MED ORDER — SENNOSIDES-DOCUSATE SODIUM 8.6-50 MG PO TABS
2.0000 | ORAL_TABLET | ORAL | Status: DC
  Administered 2020-06-18 (×2): 2 via ORAL
  Filled 2020-06-17 (×2): qty 2

## 2020-06-17 MED ORDER — KETOROLAC TROMETHAMINE 30 MG/ML IJ SOLN
INTRAMUSCULAR | Status: AC
  Filled 2020-06-17: qty 1

## 2020-06-17 MED ORDER — HYDROMORPHONE HCL 1 MG/ML IJ SOLN: 0.2000 mg | INTRAMUSCULAR | Status: DC | PRN

## 2020-06-17 MED ORDER — ONDANSETRON HCL 4 MG/2ML IJ SOLN: 4.0000 mg | Freq: Three times a day (TID) | INTRAMUSCULAR | Status: DC | PRN

## 2020-06-17 MED ORDER — DEXAMETHASONE SODIUM PHOSPHATE 10 MG/ML IJ SOLN
INTRAMUSCULAR | Status: DC | PRN
  Administered 2020-06-17: 10 mg via INTRAVENOUS

## 2020-06-17 MED ORDER — ACETAMINOPHEN 500 MG PO TABS
1000.0000 mg | ORAL_TABLET | Freq: Four times a day (QID) | ORAL | Status: DC
  Administered 2020-06-17 – 2020-06-19 (×9): 1000 mg via ORAL
  Filled 2020-06-17 (×10): qty 2

## 2020-06-17 MED ORDER — SIMETHICONE 80 MG PO CHEW
80.0000 mg | CHEWABLE_TABLET | ORAL | Status: DC | PRN
  Administered 2020-06-18: 80 mg via ORAL
  Filled 2020-06-17: qty 1

## 2020-06-17 MED ORDER — GABAPENTIN 300 MG PO CAPS
300.0000 mg | ORAL_CAPSULE | Freq: Two times a day (BID) | ORAL | Status: DC
  Administered 2020-06-17 – 2020-06-19 (×5): 300 mg via ORAL
  Filled 2020-06-17 (×5): qty 1

## 2020-06-17 MED ORDER — DIPHENHYDRAMINE HCL 25 MG PO CAPS
25.0000 mg | ORAL_CAPSULE | ORAL | Status: DC | PRN
  Filled 2020-06-17: qty 1

## 2020-06-17 MED ORDER — DIBUCAINE (PERIANAL) 1 % EX OINT: 1.0000 "application " | TOPICAL_OINTMENT | CUTANEOUS | Status: DC | PRN

## 2020-06-17 MED ORDER — DIPHENHYDRAMINE HCL 25 MG PO CAPS
25.0000 mg | ORAL_CAPSULE | Freq: Four times a day (QID) | ORAL | Status: DC | PRN
  Administered 2020-06-17: 25 mg via ORAL

## 2020-06-17 MED ORDER — KETOROLAC TROMETHAMINE 30 MG/ML IJ SOLN
30.0000 mg | Freq: Once | INTRAMUSCULAR | Status: AC | PRN
  Administered 2020-06-17: 30 mg via INTRAVENOUS

## 2020-06-17 MED ORDER — NALOXONE HCL 0.4 MG/ML IJ SOLN: 0.4000 mg | INTRAMUSCULAR | Status: DC | PRN

## 2020-06-17 MED ORDER — NALOXONE HCL 4 MG/10ML IJ SOLN
1.0000 ug/kg/h | INTRAVENOUS | Status: DC | PRN
  Filled 2020-06-17: qty 5

## 2020-06-17 MED ORDER — DIPHENHYDRAMINE HCL 50 MG/ML IJ SOLN: 12.5000 mg | INTRAMUSCULAR | Status: DC | PRN

## 2020-06-17 MED ORDER — OXYCODONE HCL 5 MG PO TABS: 5.0000 mg | ORAL_TABLET | Freq: Once | ORAL | Status: DC | PRN

## 2020-06-17 MED ORDER — SODIUM CHLORIDE 0.9 % IR SOLN
Status: DC | PRN
  Administered 2020-06-17 (×2): 1

## 2020-06-17 MED ORDER — HYDROMORPHONE HCL 1 MG/ML IJ SOLN: 0.2500 mg | INTRAMUSCULAR | Status: DC | PRN

## 2020-06-17 MED ORDER — ZOLPIDEM TARTRATE 5 MG PO TABS: 5.0000 mg | ORAL_TABLET | Freq: Every evening | ORAL | Status: DC | PRN

## 2020-06-17 MED ORDER — SODIUM CHLORIDE 0.9% FLUSH
3.0000 mL | INTRAVENOUS | Status: DC | PRN
  Administered 2020-06-18: 3 mL via INTRAVENOUS

## 2020-06-17 MED ORDER — SCOPOLAMINE 1 MG/3DAYS TD PT72
1.0000 | MEDICATED_PATCH | Freq: Once | TRANSDERMAL | Status: DC
  Administered 2020-06-17: 1.5 mg via TRANSDERMAL

## 2020-06-17 MED ORDER — STERILE WATER FOR IRRIGATION IR SOLN
Status: DC | PRN
  Administered 2020-06-17: 1

## 2020-06-17 NOTE — Transfer of Care (Signed)
Immediate Anesthesia Transfer of Care Note  Patient: Jennifer Curtis  Procedure(s) Performed: CESAREAN SECTION (N/A )  Patient Location: PACU  Anesthesia Type:Spinal  Level of Consciousness: awake, alert  and oriented  Airway & Oxygen Therapy: Patient Spontanous Breathing  Post-op Assessment: Report given to RN and Post -op Vital signs reviewed and stable  Post vital signs: Reviewed and stable  Last Vitals:  Vitals Value Taken Time  BP    Temp    Pulse    Resp    SpO2      Last Pain:  Vitals:   06/16/20 1953  TempSrc: Oral  PainSc: 0-No pain      Patients Stated Pain Goal: 3 (06/16/20 1135)  Complications: No complications documented.

## 2020-06-17 NOTE — Anesthesia Postprocedure Evaluation (Signed)
Anesthesia Post Note  Patient: [name]  Procedure(s) Performed: CESAREAN SECTION (N/A )     Patient location during evaluation: Mother Baby Anesthesia Type: Spinal Level of consciousness: oriented and awake and alert Pain management: pain level controlled Vital Signs Assessment: post-procedure vital signs reviewed and stable Respiratory status: spontaneous breathing and respiratory function stable Cardiovascular status: blood pressure returned to baseline and stable Postop Assessment: no headache, no backache, no apparent nausea or vomiting and able to ambulate Anesthetic complications: no   No complications documented.  Last Vitals:  Vitals:   06/17/20 0132 06/17/20 0133  BP:    Pulse: 77 77  Resp: 16 16  Temp:    SpO2: 99% 99%    Last Pain:  Vitals:   06/17/20 0131  TempSrc:   PainSc: 7    Pain Goal: Patients Stated Pain Goal: 3 (06/16/20 1135)  LLE Motor Response: Non-purposeful movement (06/17/20 0115)   RLE Motor Response: Non-purposeful movement (06/17/20 0115)       Epidural/Spinal Function Cutaneous sensation: Tingles (06/17/20 0115), Patient able to flex knees: Yes (06/17/20 0115), Patient able to lift hips off bed: No (06/17/20 0115), Back pain beyond tenderness at insertion site: No (06/17/20 0115), Progressively worsening motor and/or sensory loss: No (06/17/20 0115), Bowel and/or bladder incontinence post epidural: No (06/17/20 0115)  Trevor Iha

## 2020-06-17 NOTE — Lactation Note (Addendum)
This note was copied from a baby's chart. Lactation Consultation Note  Patient Name: Jennifer CurtisM Date: 06/17/2020 Reason for consult: Initial assessment;Preterm <34wks;[name];Primapara   P1 mom.  Desires to BF and provide EBM to her infant.   Mat. Meds:  Hale's Medications and Mother's Milk neurontin L2 Labetalol  L2 Procardia  L2  Mom had + breast changes during preganancy.  Mom is 9 hours post delivery.  Infant [redacted] wks gestation.  LC reviewed hand expression; easily expressed a drop of colostrum.  Mom was encouraged to hand express prior to and after pumping.  DEBP set up and pump parts, usage, and storage explained.   Mom was fit with the 21 flange.  Mom has large breasts and could benefit from a hands free, (she's registered for one but I discussed making one with the belly band).  She was waiting to eat so LC discussed with the next [facility] visit making one.    Mom was provided lactation brochure and resources were shared.    Mom was encouraged to pump every 2-3 hours/at least every 3 with a stretch at night for rest if needed, (no longer than 4 hours if possible).  Goal 8 pumps in 24 hours.   WIC [place]  Maternal Data Has patient been taught Hand Expression?: Yes Does the patient have breastfeeding experience prior to this delivery?: No  Feeding    LATCH Score                   Interventions Interventions: Hand express  Lactation Tools Discussed/Used Tools: Flanges Flange Size: 21 WIC Program: Yes Pump Review: Setup, frequency, and cleaning;Milk Storage Initiated by:: Threasa Alpha Date initiated:: 06/17/20   Consult Status Consult Status: Follow-up Date: 06/18/20    [name] [facility] 06/17/2020, 10:45 AM

## 2020-06-17 NOTE — Progress Notes (Addendum)
Subjective: Postpartum Day 0: Cesarean Delivery delivery time at 0000 Urgent CS at 28.2 weeks for Severe Preeclampsia, BPP 2/8, NRNFHT, Absent dopplers, Severe IUGR Patient reports tolerating PO and + flatus.   Pt states that she feels "really good" and is ready to see baby in NICU   Objective: Vital signs in last 24 hours: Temp:  [98 F (36.7 C)-98.6 F (37 C)] 98.2 F (36.8 C) (10/15 0900) Pulse Rate:  [74-99] 87 (10/15 0900) Resp:  [14-25] 19 (10/15 0900) BP: (103-154)/(56-111) 131/82 (10/15 0900) SpO2:  [91 %-100 %] 98 % (10/15 0900)  Physical Exam:  General: alert, cooperative, appears stated age and no distress Lochia: appropriate Uterine Fundus: firm Incision: dressing clean dry intact DVT Evaluation: No evidence of DVT seen on physical exam. Negative Homan's sign. No cords or calf tenderness. No significant calf/ankle edema. Foley remains in place  Recent Labs    06/16/20 2017 06/17/20 0552  HGB 13.7 12.6  HCT 41.3 37.1    Assessment/Plan: Status post Cesarean section. Doing well postoperatively.  Continue MgSO4 for 24 hours. DC at 0100 Keep foley until MgSO4 complete, then DC Strict I&Os Lactation Consultation Continue Labetalol 300 BID Continue Procardia 30mg  xl daily Dr. updated on pts status and progress BMI 47, ARIXTRA due to no pork products  High risk PP rounds  Jennifer B Crumpler 06/17/2020, 9:40 AM  Med rec looks like pt is on labetalol 300mg  QID.  Will confirm. - AYR

## 2020-06-17 NOTE — Progress Notes (Signed)
Critical CBG lab value of 37 in lab results does not reflect pt's status. Called RN in NICU to verify. This lab value is for her infant in NICU. Wrong label was scanned. Provider notified.

## 2020-06-18 NOTE — Progress Notes (Addendum)
Jennifer Curtis [id number] Postpartum Postoperative Day # 1  Jennifer Curtis, G1P0101, [redacted]w[redacted]d, S/P Primary LT Cesarean Section due to NRFHT, BPP 2/8, IUGR<1% and PreE with SF (HA), and Doppler studies of the umbilical arteries showed persistent reversed end-diastolic flow.. Pt was admitted on 10/8 for GHTN and IUGR which turned into preE with SR BP> 160/110s,  tx with labetalol 300mg  QID and procardia 30mg  xl, always asymptomatic with PCR of 1.98 on 10/8 and 1.04 on 10/9, all other labs remained unremarkable throughout. Was tx for 24 hour nueroprotectant upon admission, then magnesium for 24 hours for severe rang BP while still antepartum then again for 24 hour PP. BP stable on labetalol 300mg  QID and procardia 30mg  xl currently with BP of 141/94, asymptomatic.   Subjective: Patient up ad lib, denies syncope or dizziness. Reports consuming regular diet without issues and denies N/V. Patient reports 0 bowel movement + passing flatus.  Denies issues with urination and reports bleeding is "lighter."  Patient is pump feeding and newborn in in the NICU.  Desires undecided for postpartum contraception.  Pain is being appropriately managed with use of po meds. Denies HA, RUQ pain or vision changes.    Objective: Patient Vitals for the past 24 hrs:  BP Temp Temp src Pulse Resp SpO2  06/18/20 0735 (!) 141/94 98.4 F (36.9 C) Oral 85 18 100 %  06/18/20 0415 (!) 145/83 -- -- 92 -- --  06/18/20 0015 138/77 98 F (36.7 C) Oral 97 16 98 %  06/17/20 2233 -- -- -- -- 17 --  06/17/20 2105 129/75 97.8 F (36.6 C) Oral 96 19 100 %  06/17/20 2000 -- -- -- -- 18 --  06/17/20 1907 -- -- -- -- 19 --  06/17/20 1737 119/66 97.9 F (36.6 C) Oral 87 18 95 %  06/17/20 1517 128/74 98.7 F (37.1 C) Oral 91 18 94 %  06/17/20 1400 -- -- -- -- 17 --  06/17/20 1350 119/68 -- -- -- -- --  06/17/20 1300 -- -- -- -- 18 --  06/17/20 1151 133/70 98.1 F (36.7 C) Oral (!) 102 17 95 %  06/17/20 1149 126/75 -- -- 94 -- 95 %   06/17/20 1147 (!) 119/58 -- -- 96 -- --     Physical Exam:  General: alert, cooperative, appears stated age and no distress Mood/Affect: Happy Lungs: clear to auscultation, no wheezes, rales or rhonchi, symmetric air entry.  Heart: normal rate, regular rhythm, normal S1, S2, no murmurs, rubs, clicks or gallops. Breast: breasts appear normal, no suspicious masses, no skin or nipple changes or axillary nodes. Abdomen:  + bowel sounds, soft, non-tender Incision: healing well, no significant drainage, no dehiscence, no significant erythema, Honeycomb dressing  Uterine Fundus: firm, involution -1 Lochia: appropriate Skin: Warm, Dry. DVT Evaluation: No evidence of DVT seen on physical exam. Negative Homan's sign. No cords or calf tenderness. No significant calf/ankle edema. No clonus appreciated.   Labs: Recent Labs    06/16/20 0603 06/16/20 2017 06/17/20 0552  HGB 12.5 13.7 12.6  HCT 36.9 41.3 37.1  WBC 14.0* 12.4* 19.9*    CBG (last 3)  Recent Labs    06/17/20 0811  GLUCAP 37*     I/O: I/O last 3 completed shifts: In: 6457 [P.O.:1988; I.V.:4028.4; IV Piggyback:440.6] Out: 5655 [Urine:4900; Blood:755]   Assessment Postpartum Postoperative Day # 1  Jennifer Curtis, G1P0101, [redacted]w[redacted]d, S/P Primary LT Cesarean Section due to NRFHT, BPP 2/8, IUGR<1% and PreE with SF and Doppler studies of the  umbilical arteries showed persistent reversed end-diastolic flow. Pt was admitted on 10/8 for GHTN and IUGR which turned into preE with SR BP> 160/110s,  tx with labetalol 300mg  QID and procardia 30mg  xl, always asymptomatic with PCR of 1.98 on 10/8 and 1.04 on 10/9, all other labs remained unremarkable throughout. Was tx for 24 hour nueroprotectant upon admission, then magnesium for 24 hours for severe rang BP while still antepartum then again for 24 hour PP. BP stable on labetalol 300mg  QID and procardia 30mg  xl currently with BP of 141/94, asymptomatic.  Pt stable. -1 [employer].  PumpFeeding. Hemodynamically Stable with drop of 13.7-12.6, and QBL during surgery of 12/9.  Plan: Continue other mgmt as ordered PreE with SF: Continue labetalol 300mg  QID PO and procardia 30mg  xl po daily, monitor BP, hold parameters in place, no not give if BP<110/70a.  VTE Prophylactics: SCD, ambulated as tolerates.  Pain control: Motrin/Tylenol/Narcotics PRN Education given regarding options for contraception, including barrier methods, injectable contraception, IUD placement, oral contraceptives.  Breastfeeding, Lactation consult, Circumcision prior to discharge and Contraception undecided  Plan for discharge on POD#3  Dr. to be updated on patient status  [facility] NP-C, CNM 06/18/2020, 9:56 AM

## 2020-06-18 NOTE — Lactation Note (Signed)
This note was copied from a baby's chart. Lactation Consultation Note  Patient Name: Jennifer Curtis [id number] Date: 06/18/2020 Reason for consult: Follow-up assessment;[name]   P1, infant is 28+3d and is in the NICU.  LC is assisting mother with pumping . She is using a #21 flange. Mother denies having any discomfort. She was shown how to hand express again. bserved a few drops of colostrum.  Advised mother to hand express before and after pumping and do good breast massage in between pumping.  Mother receptive to all teaching.   Mother to continue with pumping every 2-3 hours for 15 mins.  Encouraged mother to so frequent STS when allowed in the NICU. Marland Kitchen Mother is aware of available LC services at [facility], BFSG'S, OP Dept, and phone # for questions or concerns about breastfeeding.  Mother receptive to all teaching and plan of care.     Maternal Data    Feeding    LATCH Score                   Interventions Interventions: Hand express;Expressed milk;DEBP  Lactation Tools Discussed/Used     Consult Status Consult Status: Follow-up Date: 06/19/20 Follow-up type: In-patient    Jennifer Curtis [facility] 06/18/2020, 10:36 AM

## 2020-06-19 MED ORDER — LABETALOL HCL 300 MG PO TABS: 300.0000 mg | ORAL_TABLET | Freq: Two times a day (BID) | ORAL | 0 refills | Status: AC

## 2020-06-19 MED ORDER — NIFEDIPINE ER 30 MG PO TB24: 30.0000 mg | ORAL_TABLET | Freq: Every day | ORAL | 0 refills | Status: AC

## 2020-06-19 MED ORDER — OXYCODONE HCL 5 MG PO TABS
5.0000 mg | ORAL_TABLET | ORAL | 0 refills | Status: AC | PRN
Start: 2020-06-19 — End: 2020-06-24

## 2020-06-19 NOTE — Discharge Summary (Signed)
Postpartum Discharge Summary  Date of Service updated 06/19/20    Patient Name: Jennifer Curtis DOB: [birth year] MRN: [id number]  Date of admission: 06/10/2020 Delivery date:06/17/2020  Delivering provider: Waymon Amato  Date of discharge: 06/19/2020  Admitting diagnosis: IUGR (intrauterine growth restriction) affecting care of mother [O36.5990] Gestational hypertension [O13.9] Intrauterine pregnancy: [redacted]w[redacted]d    Secondary diagnosis:  Active Problems:   IUGR (intrauterine growth restriction) affecting care of mother   Gestational hypertension   Postpartum care following cesarean delivery  Additional problems: none     Discharge diagnosis: Preterm Pregnancy Delivered, Preeclampsia (severe) and Anemia                                              Post partum procedures:none Augmentation: N/A Complications: None  Hospital course: Sceduled C/S   29y.o. yo G1P0101 at 223w3das admitted to the hospital 06/10/2020 for scheduled cesarean section with the following indication:Non-Reassuring FHR and Biophysical profile of 2/8.Delivery details are as follows:  Membrane Rupture Time/Date: 11:58 PM ,06/16/2020   Delivery Method:C-Section, Low Transverse  Details of operation can be found in separate operative note.  Patient had an uncomplicated postpartum course.  She is ambulating, tolerating a regular diet, passing flatus, and urinating well. Patient is discharged home in stable condition on  06/19/20        Newborn Data: Birth date:06/17/2020  Birth time:12:00 AM  Gender:Female  Living status:Living  Apgars:5 ,8  Weight:800 g     Magnesium Sulfate received: Yes: Seizure prophylaxis and neuroprotection BMZ received: Yes Rhophylac:N/A MMR:N/A T-DaP:Given prenatally Transfusion:No  Physical exam  Vitals:   06/19/20 0003 06/19/20 0433 06/19/20 0746 06/19/20 1120  BP: (!) 149/90 119/75 (!) 145/82 140/77  Pulse: (!) 105 92 90 (!) 112  Resp: _0 Temp: 98.8 F (37.1 C) 98.5 F  (36.9 C) 98.3 F (36.8 C) 98.6 F (37 C)  TempSrc: Oral Oral Axillary Oral  SpO2: 100% 98% 98% 99%  Weight:      Height:       General: alert, cooperative and no distress Lochia: appropriate Uterine Fundus: firm Incision: honeycomb dressing is clean, dry, and intact DVT Evaluation: +1 non-pitting edema, neg for pain, tenderness, and cords.  Labs: Lab Results  Component Value Date   WBC 19.9 (H) 06/17/2020   HGB 12.6 06/17/2020   HCT 37.1 06/17/2020   MCV 92.1 06/17/2020   PLT 237 06/17/2020   CMP Latest Ref Rng & Units 06/16/2020  Glucose 70 - 99 mg/dL 96  BUN 6 - 20 mg/dL 14  Creatinine 0.44 - 1.00 mg/dL 0.82  Sodium 135 - 145 mmol/L 134(L)  Potassium 3.5 - 5.1 mmol/L 4.5  Chloride 98 - 111 mmol/L 105  CO2 22 - 32 mmol/L 22  Calcium 8.9 - 10.3 mg/dL 7.9(L)  Total Protein 6.5 - 8.1 g/dL 5.5(L)  Total Bilirubin 0.3 - 1.2 mg/dL 0.2(L)  Alkaline Phos 38 - 126 U/L 67  AST 15 - 41 U/L 16  ALT 0 - 44 U/L 17   Edinburgh Score: Edinburgh Postnatal Depression Scale Screening Tool 06/18/2020  I have been able to laugh and see the funny side of things. 0  I have looked forward with enjoyment to things. 1  I have blamed myself unnecessarily when things went wrong. 2  I have been anxious or worried for no good reason.  2  I have felt scared or panicky for no good reason. 2  Things have been getting on top of me. 1  I have been so unhappy that I have had difficulty sleeping. 0  I have felt sad or miserable. 1  I have been so unhappy that I have been crying. 1  The thought of harming myself has occurred to me. 0  Edinburgh Postnatal Depression Scale Total 10      After visit meds:  Allergies as of 06/19/2020      Reactions   Fish Allergy Anaphylaxis   Pork-derived Products Other (See Comments)   Personal preference      Medication List    STOP taking these medications   aspirin 81 MG EC tablet     TAKE these medications   labetalol 300 MG tablet Commonly known  as: NORMODYNE Take 1 tablet (300 mg total) by mouth 2 (two) times daily.   NIFEdipine 30 MG 24 hr tablet Commonly known as: ADALAT CC Take 1 tablet (30 mg total) by mouth daily. Start taking on: June 20, 2020   oxyCODONE 5 MG immediate release tablet Commonly known as: Oxy IR/ROXICODONE Take 1 tablet (5 mg total) by mouth every 4 (four) hours as needed for up to 5 days for moderate pain.   PNV PO Take 1 capsule by mouth daily.        Discharge home in stable condition Infant Feeding: Breast Infant Disposition:NICU Discharge instruction: per After Visit Summary and Postpartum booklet. Activity: Advance as tolerated. Pelvic rest for 6 weeks.  Diet: low salt diet Anticipated Birth Control: Unsure Postpartum Appointment:6 weeks Additional Postpartum F/U: BP check 1 week Future Appointments:No future appointments. Follow up Visit:  Follow-up Information    Waymon Amato, MD. Schedule an appointment as soon as possible for a visit in 1 week(s).   Specialty: Obstetrics and Gynecology Why: Return to [facility] in 1 week for BP check and then in 6 weeks for regular postpartum visit.  Contact information: [address] [place] [address] [phone]                   06/19/2020 Arrie Eastern, CNM

## 2020-06-19 NOTE — Lactation Note (Signed)
This note was copied from a baby's chart. Lactation Consultation Note  Patient Name: Boy Melonie Germani [id number] Date: 06/19/2020 Reason for consult: Follow-up assessment   Mother unsure if she is going to be discharged today or not.  She reports that she is pumping every 3 hours. She reports that she has been very sleepy. Mother denies having a need for anything. She was given more bottles for pumping. She has BM labels at the bedside.  I did discuss treatment and prevention of engorgement with her.  Mother plans to phone [facility] tomorrow. Parents do have a pump that was given to them by a class that the father was taking on baby care.   Mother to follow up with [facility] as needed.    Maternal Data    Feeding    LATCH Score                   Interventions    Lactation Tools Discussed/Used     Consult Status Consult Status: Follow-up Date: 06/20/20 Follow-up type: In-patient    [name] [facility] 06/19/2020, 11:22 AM

## 2020-06-20 ENCOUNTER — Encounter (HOSPITAL_COMMUNITY): Payer: Self-pay | Admitting: Obstetrics & Gynecology

## 2020-06-20 LAB — SURGICAL PATHOLOGY

## 2020-06-21 ENCOUNTER — Ambulatory Visit: Payer: Self-pay

## 2020-06-21 NOTE — Lactation Note (Signed)
This note was copied from a baby's chart. Lactation Consultation Note  Patient Name: Jennifer Curtis [id number] Date: 06/21/2020 Reason for consult: Follow-up assessment;Primapara;1st time breastfeeding;[name];Preterm <34wks;Infant < 6lbs  LC visited with P1 Mom of 29wk AGA infant on day 4.  Mom has been consistently double pumping using a Medela Symphony DEBP in baby's room or her WIC pump at home.    Mom's pumping volume has increased to 60 ml per pumping.  Encouraged Mom to continue consistent pumping and when able to, do STS with baby.  Encouraged at least 8 pumping sessions per 24 hrs, using the maintenance setting on pump. (LC showed Mom this)  Parents were happy to report that baby is getting oral care with Mom's EBM, due to baby being NPO.    Mom about to start a pumping session.  Belly-band provided with instructions on how to cut a hole where nipple is to hold pump in place.  Mom knows she can massage her breasts during pumping and even have baby STS tucked in belly band while pumping.   Mom denies any questions.  Using 21 mm flanges, Mom reports that nipples are moving freely, no pain or rubbing against flange.    Mom knows to ask baby's nurse for lactation consult prn.   Interventions Interventions: Breast feeding basics reviewed;Skin to skin;Breast massage;Hand express;DEBP  Lactation Tools Discussed/Used Tools: Pump;Flanges Flange Size: 21 Breast pump type: Double-Electric Breast Pump   Consult Status Consult Status: Follow-up Date: 06/28/20 Follow-up type: In-patient    [name] 06/21/2020, 3:49 PM

## 2020-06-30 ENCOUNTER — Ambulatory Visit: Payer: Self-pay

## 2020-06-30 NOTE — Lactation Note (Signed)
This note was copied from a baby's chart. Lactation Consultation Note  Patient Name: Jennifer Curtis [id number] Date: 06/30/2020 Reason for consult: Follow-up assessment;Primapara;1st time breastfeeding;[name];Infant < 6lbs;Preterm <34wks  LC in to visit with P1 Mom of [redacted]w[redacted]d AGA baby in the NICU.  Baby is 13 days old.   Mom standing up at side of isolette comforting baby.  Mom states she is pumping consistently and expressing >60 ml per pumping.  Mom is using the Medela Symphony in baby's room and her WIC pump at home.  Mom denies any difficulty with pumping, but did ask for another belly band to create a hand's free pumping bra to keep at hospital as her other one is at home.  Support given to Mom about how well she is doing.  Mom has held baby STS most days.  Mom aware of lactation support available to her, and encouraged her to ask for LC prn.  Interventions Interventions: Breast feeding basics reviewed;Skin to skin;Breast massage;Hand express;DEBP  Lactation Tools Discussed/Used Tools: Pump Breast pump type: Double-Electric Breast Pump   Consult Status Consult Status: Follow-up Date: 07/07/20 Follow-up type: In-patient    [name] 06/30/2020, 2:04 PM

## 2020-07-07 ENCOUNTER — Ambulatory Visit: Payer: Self-pay

## 2020-07-07 NOTE — Lactation Note (Signed)
This note was copied from a baby's chart. Lactation Consultation Note  Patient Name: Jennifer Curtis Date: 07/07/2020 Reason for consult: Initial assessment;[name];Preterm <34wks   LC to room for f/u visit. Mom continues to pump with sufficient output to meet infant's needs. Reviewed milk storage guidelines. Mom with questions about IDF and readiness cues. Advised that assessments begin around 34 weeks. She will f/u with her RN if she has further questions. Mother is aware that [facility] services are available. Will plan f/u visit.  Consult Status Consult Status: Follow-up Date: 07/08/20 Follow-up type: In-patient    Elder Negus 07/07/2020, 2:53 PM

## 2020-07-16 ENCOUNTER — Ambulatory Visit: Payer: Self-pay

## 2020-07-16 NOTE — Lactation Note (Signed)
This note was copied from a baby's chart. Lactation Consultation Note  Patient Name: Jennifer Curtis OKHTX'H Date: 07/16/2020 Reason for consult: [name];Follow-up assessment  LC to room for f/u visit. Mom continues to pump without difficulty. Patient was provided with the opportunity to ask questions. All concerns were addressed.  Will plan f/u visit.   Consult Status Consult Status: Follow-up Date: 07/17/20 Follow-up type: In-patient    Elder Negus 07/16/2020, 3:16 PM

## 2020-07-23 ENCOUNTER — Ambulatory Visit: Payer: Self-pay

## 2020-07-23 NOTE — Lactation Note (Signed)
This note was copied from a baby's chart. Lactation Consultation Note  Patient Name: Jennifer Curtis Date: 07/23/2020 Reason for consult: Follow-up assessment;[name]  LC to room for f/u visit. Mom continues to pump 8xday. Her supply is sufficient to meet her infant's needs. No storage containers/labels needed at this time. Mom is aware of LC services.   Consult Status Consult Status: Follow-up Date: 07/24/20 Follow-up type: In-patient    Elder Negus 07/23/2020, 2:03 PM

## 2020-08-06 ENCOUNTER — Ambulatory Visit: Payer: Self-pay

## 2020-08-06 NOTE — Lactation Note (Signed)
This note was copied from a baby's chart. LC attempted f/u visit. Mother not available. Will plan f/u visit. No charge.    Elder Negus 08/06/2020, 12:02 PM

## 2020-08-09 ENCOUNTER — Ambulatory Visit: Payer: Self-pay

## 2020-08-09 NOTE — Lactation Note (Signed)
This note was copied from a baby's chart. LC made 2 follow up consult attempts today. Will plan f/u visit next week. No charge.    Jakolby Sedivy R Robena Ewy, MA IBCLC 08/09/2020, 3:51 PM    

## 2020-08-11 ENCOUNTER — Ambulatory Visit: Payer: Self-pay

## 2020-08-11 NOTE — Lactation Note (Addendum)
This note was copied from a baby's chart. Patient Name: Jennifer Curtis [facility]) Today's Date: 08/11/2020 Reason for consult: Follow-up assessment   Infant Data and Feeding GA 28+3 CGA 36+2 Currently gavage feeding mother's milk Demonstrating feeding readiness: hands in mouth, sucking on pacifier, waking for cares Direct bf not appropriate at this time because baby is receiving HFNC 4L  Maternal Data  g1p1 C-Section Hx GHTN Currently pumping 6xday with 3oz yield each session  Lactation Consultation Note LC to room for weekly f/u visit. Mother continues to pump to meet infant's nutritional needs. Mother with questions about IDF. Reviewed readiness scoring and need for decreased supplemental oxygen needs before bf is appropriate for [name]. LC assured mother that lactation team will continue to follow up and provide bf assistance when clinically appropriate. Mother was provided with the opportunity to ask questions. All concerns were addressed.  Conversation with mom was relayed to infant's RN.  Consult Status Consult Status: Follow-up Date:  (1 week or with IDF readiness) Follow-up type: In-patient   Elder Negus, MA IBCLC 08/11/2020, 11:54 AM

## 2020-08-20 ENCOUNTER — Ambulatory Visit: Payer: Self-pay

## 2020-08-20 NOTE — Lactation Note (Signed)
This note was copied from a baby's chart. Lactation Consultation Note  Patient Name: Jennifer Curtis Date: 08/20/2020 Reason for consult: Follow-up assessment;Early term 37-38.6wks;Infant < 6lbs  LC in to visit with P48 Mom of 11 month old baby in the NICU.  Baby is [redacted]w[redacted]d AGA and having difficulty staying on growth curve.  Baby on HFNC at 30-35% O2.  Baby is being exclusively gavage fed and baby is being changed from fortified BM 26 cal to 30 cal Special Care temporarily to monitor growth.   Encouraged Mom to continue to support her milk supply and emphatically Mom responded "don't you worry, I'm not stopping".    Mom is pumping 8 times per 24 hrs and expressing about 90 ml per session.  Support given for her commitment.    Interventions Interventions: Breast feeding basics reviewed;DEBP;Hand express;Breast massage;Skin to skin  Lactation Tools Discussed/Used Tools: Pump Breast pump type: Double-Electric Breast Pump   Consult Status Consult Status: Follow-up Date: 08/22/20 Follow-up type: In-patient    Jennifer Curtis 08/20/2020, 4:07 PM

## 2020-08-21 ENCOUNTER — Ambulatory Visit: Payer: Self-pay

## 2020-08-21 NOTE — Lactation Note (Signed)
This note was copied from a baby's chart. Lactation Consultation Note LC to room for f/u visit. Mom continues to pump 8xday with yield of about 3oz per pumping. No questions/concerns at this time. Will plan f/u visit. Mom aware of LC services.   Patient Name: Jennifer Curtis FXOVA'N Date: 08/21/2020 Reason for consult: Follow-up assessment;[name]   Consult Status Consult Status: Follow-up Follow-up type: In-patient    Elder Negus, MA IBCLC 08/21/2020, 3:57 PM

## 2020-08-28 ENCOUNTER — Ambulatory Visit: Payer: Self-pay

## 2020-08-28 NOTE — Lactation Note (Signed)
This note was copied from a baby's chart. Lactation Consultation Note LC to room for f/u visit. Mom continues to pump q 3 hours with 4oz yield per pumping. No IDF at this time because of oxygen needs. Mother is aware that Lactation staff will assist and support when baby is able to bf. Patient was provided with the opportunity to ask questions. All concerns were addressed.  Will plan f/u visit next week.   Patient Name: Jennifer Curtis XJOIT'G Date: 08/28/2020 Reason for consult: [name];Follow-up assessment Age:76 m.o.   Consult Status Consult Status: Follow-up Follow-up type: In-patient    Elder Negus, MA IBCLC 08/28/2020, 9:45 AM

## 2020-09-03 ENCOUNTER — Ambulatory Visit: Payer: Self-pay

## 2020-09-03 NOTE — Lactation Note (Signed)
This note was copied from a baby's chart. Lactation Consultation Note  Patient Name: Jennifer Curtis Perine [id number] Date: 09/03/2020 Reason for consult: Follow-up assessment;Primapara;1st time breastfeeding;[name];Preterm <34wks Age:28 m.o.  1215 - 1228 - I followed up with [name]. She was bathing her son, [name] upon entry. She has been pumping exclusively while baby is in the NICU. She states that she pumps about 3 times a day and obtains 3-4 ounces/pump for an average daily total of 9-12 ounces. She pumps each breast separately.  [name] stated that she would like to see more milk volume. She has noticed a decrease in her production since she initiated several months ago. I discussed supply and demand nature of milk production and recommended increasing her pumping frequency to eight times a day. We discussed pumping during touch times. She states that she would try to pump more often; she states that her schedule has changed and she has not been able to pump as consistently.  I also inquired about pumping the breasts individually. She states that this is due to not having a pumping bra. I provided her with a belly band (she had one initially but it ripped), and I encouraged her to see if she had an older sports bra or stretchy bra that she could convert to a pumping bra. We discussed benefits of double pumping to her milk production and to her overall time pumping.  She thanked me for these strategies. I encouraged lactation follow up next week to monitor her progress.   Maternal Data Does the patient have breastfeeding experience prior to this delivery?: No   Interventions Interventions: Breast feeding basics reviewed  Lactation Tools Discussed/Used Pump Education: Setup, frequency, and cleaning   Consult Status Consult Status: Follow-up Date: 09/07/20 Follow-up type: In-patient    Walker Shadow 09/03/2020, 12:35 PM

## 2020-09-04 ENCOUNTER — Ambulatory Visit: Payer: Self-pay

## 2020-09-04 NOTE — Lactation Note (Signed)
This note was copied from a baby's chart. Lactation Consultation Note  Pt. Sleeping when [facility] visited room.    Patient Name: Jennifer Curtis JQBHA'L Date: 09/04/2020   Age:28 m.o.  Maternal Data    Feeding Feeding Type: Formula  LATCH Score                   Interventions    Lactation Tools Discussed/Used     Consult Status      [name] [facility] 09/04/2020, 7:58 AM

## 2020-09-04 NOTE — Lactation Note (Signed)
This note was copied from a baby's chart. Lactation Consultation Note  Patient Name: Boy Urania Pearlman ENIDP'O Date: 09/04/2020 Reason for consult: Follow-up assessment;[name] Age:28 m.o.   LC entered room and mom was pumping Left breast only.  Asked mom how pumping was going and if she was experiencing any discomfort. Mom denied any pain or discomfort and stated flanges felt fine and that pumping was going well.   LC asked mom if her preference was to pump just one breast at a time.  Mom stated that she had just woken up and just did what she felt was convenient at the time.   LC also inquired about frequency of pumping within a 24 hour timeframe and mom stated that currently she's only pumping 6 times within 24 hours, but it working hard to get back to pumping 8 times within 24 hours.  LC encouraged mom that she's doing a great job.    LC asked mom if she had any additional questions or concerns and mom denied any at this time.  LC informed mom that if she wanted the [facility] to come back regarding anything that she could let her nurse know to call Lactation and a LC will follow-up.   Maternal Data    Feeding Feeding Type: Breast Fed  LATCH Score                   Interventions    Lactation Tools Discussed/Used Breast pump type: Double-Electric Breast Pump   Consult Status Consult Status: PRN Follow-up type: Call as needed    Scarlette Ar 09/04/2020, 12:00 PM

## 2020-09-11 ENCOUNTER — Ambulatory Visit: Payer: Self-pay

## 2020-09-11 NOTE — Lactation Note (Signed)
This note was copied from a baby's chart. Lactation Consultation Note  Patient Name: [name] Date: 09/11/2020   Age:27 m.o.   LC spoke to Mom after SLP requested breastfeeding assistance at next feeding.  Mom told LC that she wouldn't be here for 3 pm feeding, but would love assistance tomorrow.    LC will assist 09/11/2020, RN to call     [name] 09/11/2020, 1:34 PM

## 2020-09-12 ENCOUNTER — Ambulatory Visit: Payer: Self-pay

## 2020-09-12 NOTE — Lactation Note (Signed)
This note was copied from a baby's chart. Lactation Consultation Note  Patient Name: Jennifer Curtis Date: 09/12/2020 Reason for consult: Follow-up assessment;Mother's request;[name] Age:28 m.o.  LC to room for feeding assistance. Placed baby in football hold. Mom with 72mm shield. Baby latched briefly but became upset and tachycardic. Mother offered pacifier and swaddled baby in her arms and he calmed. Relayed to RN. LC will return to re-challenge at 1500 feeding per mother's request.  Consult Status Consult Status: Follow-up Follow-up type: In-patient    Elder Negus, MA IBCLC 09/12/2020, 12:18 PM

## 2020-09-12 NOTE — Lactation Note (Signed)
This note was copied from a baby's chart. Lactation Consultation Note  Patient Name: Jennifer Curtis EQAST'M Date: 09/12/2020 Reason for consult: Mother's request;[name] Age:28 m.o.  LC returned to room for 1500 feeding. Infant continued to struggle with latch. LC suggested sts and re-challenging later this week. Assured mother that learning to bf is a process and [name] is learning even before he achieves latch and feeding. LC left mom and baby sts.  Consult Status Consult Status: Follow-up Follow-up type: In-patient   Elder Negus, MA IBCLC 09/12/2020, 3:23 PM

## 2020-09-18 ENCOUNTER — Ambulatory Visit: Payer: Self-pay

## 2020-09-18 NOTE — Lactation Note (Signed)
This note was copied from a baby's chart. Lactation Consultation Note  Patient Name: Jennifer Curtis [id number] Date: 09/18/2020 Reason for consult: Follow-up assessment;[name];Term Age:28 m.o.  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  Mom holding baby clothed in her arms.    Asked Mom about STS and breastfeeding baby.  Mom stated that she tried last week and was going to try again this coming week.  Mom desires breastfeeding assistance tomorrow at 12 noon feeding.   Mom states she is pumping about 6 times per 24 hrs.  Baby doesn't need to go to a pumped breast per Mom from SLP.  Baby is to be monitored for increased WOB during feedings.    Interventions Interventions: Breast feeding basics reviewed;DEBP  Lactation Tools Discussed/Used Tools: Pump;Bottle Breast pump type: Double-Electric Breast Pump   Consult Status Consult Status: Follow-up Date: 09/19/20 Follow-up type: In-patient    [name] 09/18/2020, 2:35 PM

## 2020-09-19 ENCOUNTER — Ambulatory Visit: Payer: Self-pay

## 2020-09-19 NOTE — Lactation Note (Signed)
This note was copied from a baby's chart. Lactation Consultation Note  Patient Name: Jennifer Curtis HQITU'Y Date: 09/19/2020 Reason for consult: [name];Follow-up assessment Age:28 m.o. LC to room for bf assistance. Baby has not initiated IDF but is able to bf prior to gavage, per RN. Mom is not instructed to pre-pump before bf. LC assisted with positioning and observed independent latch with rhythmic suckling and audible swallows. Baby nutritively fed for 6 minutes. He was content p bf. Mom was pleased. Mother was provided with the opportunity to ask questions. All concerns were addressed.  LC relayed feeding impression to RN. Will plan follow up visit.   LATCH Score Latch: Grasps breast easily, tongue down, lips flanged, rhythmical sucking.  Audible Swallowing: Spontaneous and intermittent  Type of Nipple: Everted at rest and after stimulation  Comfort (Breast/Nipple): Soft / non-tender  Hold (Positioning): Assistance needed to correctly position infant at breast and maintain latch.  LATCH Score: 9    Consult Status Consult Status: Follow-up Follow-up type: In-patient    Elder Negus, MA IBCLC 09/19/2020, 12:32 PM

## 2020-09-28 ENCOUNTER — Ambulatory Visit: Payer: Self-pay

## 2020-09-28 NOTE — Lactation Note (Signed)
This note was copied from a baby's chart. Lactation Consultation Note  Patient Name: Boy Jerrilyn Messinger YIRSW'N Date: 09/28/2020 Reason for consult: [name];Follow-up assessment Age:28 m.o.  LC to room for f/u visit. Mom continues to pump 6 x day and yields about 4 oz per pumping. Mom is bottle feeding and occasionally challenges baby at breast. Mom is aware of LC services. Mother was provided with the opportunity to ask questions. All concerns were addressed.  Will plan follow up visit.   Consult Status Consult Status: Follow-up Follow-up type: In-patient   Elder Negus, MA IBCLC 09/28/2020, 5:47 PM

## 2021-02-16 ENCOUNTER — Ambulatory Visit: Payer: Medicaid Other | Attending: Critical Care Medicine

## 2021-02-16 DIAGNOSIS — Z20822 Contact with and (suspected) exposure to covid-19: Secondary | ICD-10-CM

## 2021-02-17 LAB — NOVEL CORONAVIRUS, NAA: SARS-CoV-2, NAA: DETECTED — AB

## 2021-02-17 LAB — SARS-COV-2, NAA 2 DAY TAT

## 2022-02-22 ENCOUNTER — Emergency Department (HOSPITAL_COMMUNITY)
Admission: EM | Admit: 2022-02-22 | Discharge: 2022-02-22 | Disposition: A | Discharge status: Home / Self Care | Payer: Medicaid Other | Attending: Emergency Medicine | Admitting: Emergency Medicine

## 2022-02-22 ENCOUNTER — Emergency Department (HOSPITAL_COMMUNITY): Payer: Medicaid Other

## 2022-02-22 ENCOUNTER — Encounter (HOSPITAL_COMMUNITY): Payer: Self-pay | Admitting: Emergency Medicine

## 2022-02-22 DIAGNOSIS — Y92410 Unspecified street and highway as the place of occurrence of the external cause: Secondary | ICD-10-CM | POA: Insufficient documentation

## 2022-02-22 DIAGNOSIS — I10 Essential (primary) hypertension: Secondary | ICD-10-CM | POA: Diagnosis not present

## 2022-02-22 DIAGNOSIS — S3992XA Unspecified injury of lower back, initial encounter: Secondary | ICD-10-CM | POA: Diagnosis not present

## 2022-02-22 DIAGNOSIS — Z79899 Other long term (current) drug therapy: Secondary | ICD-10-CM | POA: Diagnosis not present

## 2022-02-22 DIAGNOSIS — S199XXA Unspecified injury of neck, initial encounter: Secondary | ICD-10-CM | POA: Diagnosis present

## 2022-02-22 LAB — I-STAT BETA HCG BLOOD, ED (MC, WL, AP ONLY): I-stat hCG, quantitative: <5 m[IU]/mL (ref <5)

## 2022-02-22 MED ORDER — IBUPROFEN 800 MG PO TABS: 800.0000 mg | ORAL_TABLET | Freq: Three times a day (TID) | ORAL | 0 refills | Status: AC | PRN

## 2022-02-22 NOTE — Discharge Instructions (Signed)
Follow-up with your family doctor or [facility] if not improving
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
## 2022-08-16 ENCOUNTER — Ambulatory Visit (INDEPENDENT_AMBULATORY_CARE_PROVIDER_SITE_OTHER): Payer: BLUE CROSS/BLUE SHIELD | Admitting: Podiatry

## 2022-08-16 ENCOUNTER — Ambulatory Visit (INDEPENDENT_AMBULATORY_CARE_PROVIDER_SITE_OTHER): Payer: BLUE CROSS/BLUE SHIELD

## 2022-08-16 ENCOUNTER — Encounter: Payer: Self-pay | Admitting: Podiatry

## 2022-08-16 VITALS — BP 145/95

## 2022-08-16 DIAGNOSIS — M722 Plantar fascial fibromatosis: Secondary | ICD-10-CM

## 2022-08-16 MED ORDER — TRIAMCINOLONE ACETONIDE 10 MG/ML IJ SUSP
10.0000 mg | Freq: Once | INTRAMUSCULAR | Status: AC
  Administered 2022-08-16: 10 mg

## 2022-08-16 MED ORDER — DICLOFENAC SODIUM 75 MG PO TBEC: 75.0000 mg | DELAYED_RELEASE_TABLET | Freq: Two times a day (BID) | ORAL | 2 refills | Status: AC

## 2022-08-16 NOTE — Progress Notes (Signed)
Subjective:   Patient ID: Jennifer Curtis, female   DOB: 31 y.o.   MRN: [id number]   HPI Patient presents stating that she has a lot of pain in the right medial and that that is been going on now for several months.  Patient is working and standing a lot patient does not smoke likes to be a   Review of Systems  All other systems reviewed and are negative.       Objective:  Physical Exam Vitals and nursing note reviewed.  Constitutional:      Appearance: She is well-developed.  Pulmonary:     Effort: Pulmonary effort is normal.  Musculoskeletal:        General: Normal range of motion.  Skin:    General: Skin is warm.  Neurological:     Mental Status: She is alert.     Neurovascular status intact muscle strength found to be adequate range of motion adequate.  Patient is noted to have exquisite discomfort right plantar fascia at the insertional point tendon calcaneus inflammation fluid around the medial band.  Patient has good digital perfusion well oriented x 3 moderate depression of the arch     Assessment:  Acute plantar fasciitis right inflammation fluid medial band     Plan:  H&P reviewed condition sterile prep injected the fascia at insertion 3 mg Kenalog 5 mg Xylocaine applied fascial brace to lift up the arch which was properly fitted into the arch in order to elevate and placed on oral diclofenac.  Gave instructions on physical therapy stretching exercises and shoe gear modifications  X-rays indicate that there is minimal spur no indication of fracture arthritis

## 2022-08-16 NOTE — Patient Instructions (Signed)

## 2022-08-30 ENCOUNTER — Ambulatory Visit (INDEPENDENT_AMBULATORY_CARE_PROVIDER_SITE_OTHER): Payer: BLUE CROSS/BLUE SHIELD | Admitting: Podiatry

## 2022-08-30 ENCOUNTER — Encounter: Payer: Self-pay | Admitting: Podiatry

## 2022-08-30 DIAGNOSIS — M722 Plantar fascial fibromatosis: Secondary | ICD-10-CM | POA: Diagnosis not present

## 2022-08-30 NOTE — Progress Notes (Signed)
Subjective:   Patient ID: Jennifer Curtis, female   DOB: 31 y.o.   MRN: [id number]   HPI Patient states she has quite a bit of improvement with only mild discomfort in the plantar fascia at this time and states that she is walking with a better pattern   ROS      Objective:  Physical Exam  Neurovascular status intact significant reduction in inflammation of the right heel with pain still present only upon deep palpation moderate flatfoot     Assessment:  Improved fasciitis-like symptomatology right     Plan:  Reviewed condition recommended continued stretching supportive shoes not going barefoot or wearing flat shoes and I am discharging but I did get all those instructions if any issues were to occur may require more aggressive conservative approach
